# Patient Record
Sex: Male | Born: 1960 | Race: White | Hispanic: No | Marital: Single | Smoking: Current every day smoker
Health system: Southern US, Community
[De-identification: ages and names within clinical notes are randomized; demographics above are authoritative.]

## PROBLEM LIST (undated history)

## (undated) DIAGNOSIS — I10 Essential (primary) hypertension: Secondary | ICD-10-CM

## (undated) HISTORY — PX: HAND SURGERY: SHX662

## (undated) HISTORY — PX: ABDOMINAL SURGERY: SHX537

---

## 1999-07-08 ENCOUNTER — Emergency Department (HOSPITAL_COMMUNITY): Admission: EM | Admit: 1999-07-08 | Discharge: 1999-07-08 | Payer: Self-pay | Admitting: Emergency Medicine

## 2000-03-21 ENCOUNTER — Inpatient Hospital Stay (HOSPITAL_COMMUNITY): Admission: EM | Admit: 2000-03-21 | Discharge: 2000-03-21 | Payer: Self-pay | Admitting: *Deleted

## 2000-03-21 ENCOUNTER — Encounter: Payer: Self-pay | Admitting: *Deleted

## 2001-06-03 ENCOUNTER — Encounter: Payer: Self-pay | Admitting: Specialist

## 2001-06-03 ENCOUNTER — Inpatient Hospital Stay (HOSPITAL_COMMUNITY): Admission: EM | Admit: 2001-06-03 | Discharge: 2001-06-05 | Payer: Self-pay | Admitting: Emergency Medicine

## 2001-06-03 ENCOUNTER — Encounter: Payer: Self-pay | Admitting: Emergency Medicine

## 2010-10-29 ENCOUNTER — Observation Stay (HOSPITAL_COMMUNITY)
Admission: EM | Admit: 2010-10-29 | Discharge: 2010-10-30 | Disposition: A | Discharge status: Home / Self Care | Payer: Self-pay

## 2010-10-29 DIAGNOSIS — S0003XA Contusion of scalp, initial encounter: Secondary | ICD-10-CM | POA: Insufficient documentation

## 2010-10-29 DIAGNOSIS — IMO0002 Reserved for concepts with insufficient information to code with codable children: Secondary | ICD-10-CM | POA: Insufficient documentation

## 2010-10-29 DIAGNOSIS — F17200 Nicotine dependence, unspecified, uncomplicated: Secondary | ICD-10-CM | POA: Insufficient documentation

## 2010-10-29 DIAGNOSIS — Y929 Unspecified place or not applicable: Secondary | ICD-10-CM | POA: Insufficient documentation

## 2010-10-29 DIAGNOSIS — F1020 Alcohol dependence, uncomplicated: Secondary | ICD-10-CM | POA: Insufficient documentation

## 2010-10-29 DIAGNOSIS — Z23 Encounter for immunization: Secondary | ICD-10-CM | POA: Insufficient documentation

## 2010-10-29 DIAGNOSIS — S1093XA Contusion of unspecified part of neck, initial encounter: Secondary | ICD-10-CM | POA: Insufficient documentation

## 2010-10-29 DIAGNOSIS — S066X0A Traumatic subarachnoid hemorrhage without loss of consciousness, initial encounter: Principal | ICD-10-CM | POA: Insufficient documentation

## 2010-10-29 LAB — CBC
HCT: 39.7 % (ref 39.0–52.0)
Hemoglobin: 13.1 g/dL (ref 13.0–17.0)
MCH: 30.6 pg (ref 26.0–34.0)
MCHC: 33 g/dL (ref 30.0–36.0)
MCV: 92.8 fL (ref 78.0–100.0)
Platelets: 322 10*3/uL (ref 150–400)
RBC: 4.28 MIL/uL (ref 4.22–5.81)
RDW: 13.9 % (ref 11.5–15.5)
WBC: 10.1 10*3/uL (ref 4.0–10.5)

## 2010-10-29 LAB — DIFFERENTIAL
Basophils Absolute: 0.1 10*3/uL (ref 0.0–0.1)
Basophils Relative: 1 % (ref 0–1)
Eosinophils Absolute: 0.1 10*3/uL (ref 0.0–0.7)
Eosinophils Relative: 1 % (ref 0–5)
Lymphocytes Relative: 32 % (ref 12–46)
Lymphs Abs: 3.2 10*3/uL (ref 0.7–4.0)
Monocytes Absolute: 0.6 10*3/uL (ref 0.1–1.0)
Monocytes Relative: 6 % (ref 3–12)
Neutro Abs: 6.2 10*3/uL (ref 1.7–7.7)
Neutrophils Relative %: 61 % (ref 43–77)

## 2010-10-29 LAB — BASIC METABOLIC PANEL
CO2: 21 mEq/L (ref 19–32)
Calcium: 9 mg/dL (ref 8.4–10.5)
Chloride: 102 mEq/L (ref 96–112)
Creatinine, Ser: 1.08 mg/dL (ref 0.4–1.5)
GFR calc Af Amer: >60 mL/min (ref >60)
Glucose, Bld: 128 mg/dL — ABNORMAL HIGH (ref 70–99)
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
## 2011-03-22 ENCOUNTER — Emergency Department (HOSPITAL_COMMUNITY)
Admission: EM | Admit: 2011-03-22 | Discharge: 2011-03-23 | Disposition: A | Discharge status: Home / Self Care | Payer: Self-pay | Attending: Emergency Medicine | Admitting: Emergency Medicine

## 2011-03-22 ENCOUNTER — Emergency Department (HOSPITAL_COMMUNITY): Payer: Self-pay

## 2011-03-22 DIAGNOSIS — R109 Unspecified abdominal pain: Secondary | ICD-10-CM | POA: Insufficient documentation

## 2011-03-22 DIAGNOSIS — R112 Nausea with vomiting, unspecified: Secondary | ICD-10-CM | POA: Insufficient documentation

## 2011-03-22 DIAGNOSIS — F1020 Alcohol dependence, uncomplicated: Secondary | ICD-10-CM | POA: Insufficient documentation

## 2011-03-22 DIAGNOSIS — N200 Calculus of kidney: Secondary | ICD-10-CM | POA: Insufficient documentation

## 2011-03-22 DIAGNOSIS — F1010 Alcohol abuse, uncomplicated: Secondary | ICD-10-CM | POA: Insufficient documentation

## 2011-03-22 LAB — DIFFERENTIAL
Lymphs Abs: 1.6 10*3/uL (ref 0.7–4.0)
Monocytes Absolute: 0.4 10*3/uL (ref 0.1–1.0)
Monocytes Relative: 6 % (ref 3–12)
Neutro Abs: 5.2 10*3/uL (ref 1.7–7.7)
Neutrophils Relative %: 71 % (ref 43–77)

## 2011-03-22 LAB — URINALYSIS, ROUTINE W REFLEX MICROSCOPIC
Glucose, UA: NEGATIVE mg/dL
Ketones, ur: NEGATIVE mg/dL
Leukocytes, UA: NEGATIVE
Nitrite: NEGATIVE
Protein, ur: NEGATIVE mg/dL
Urobilinogen, UA: 0.2 mg/dL (ref 0.0–1.0)

## 2011-03-22 LAB — CBC
Hemoglobin: 15.7 g/dL (ref 13.0–17.0)
MCH: 30.9 pg (ref 26.0–34.0)
MCHC: 34.1 g/dL (ref 30.0–36.0)
MCV: 90.6 fL (ref 78.0–100.0)
RBC: 5.08 MIL/uL (ref 4.22–5.81)

## 2011-03-22 LAB — COMPREHENSIVE METABOLIC PANEL
BUN: 9 mg/dL (ref 6–23)
CO2: 27 mEq/L (ref 19–32)
Calcium: 8.8 mg/dL (ref 8.4–10.5)
Creatinine, Ser: 0.72 mg/dL (ref 0.50–1.35)
GFR calc Af Amer: >60 mL/min (ref >60)
GFR calc non Af Amer: >60 mL/min (ref >60)
Glucose, Bld: 125 mg/dL — ABNORMAL HIGH (ref 70–99)
Total Protein: 7.3 g/dL (ref 6.0–8.3)

## 2011-03-22 LAB — URINE MICROSCOPIC-ADD ON

## 2011-03-22 LAB — LIPASE, BLOOD: Lipase: 54 U/L (ref 11–59)

## 2011-03-22 LAB — ETHANOL: Alcohol, Ethyl (B): 269 mg/dL — ABNORMAL HIGH (ref 0–11)

## 2011-03-23 MED ORDER — IOHEXOL 300 MG/ML  SOLN
125.0000 mL | Freq: Once | INTRAMUSCULAR | Status: AC | PRN
  Administered 2011-03-23: 125 mL via INTRAVENOUS

## 2011-03-24 LAB — URINE CULTURE
Colony Count: 4000
Culture  Setup Time: 201206240334

## 2011-04-16 ENCOUNTER — Emergency Department (HOSPITAL_COMMUNITY)
Admission: EM | Admit: 2011-04-16 | Discharge: 2011-04-18 | Disposition: A | Discharge status: Home / Self Care | Payer: Self-pay | Attending: Emergency Medicine | Admitting: Emergency Medicine

## 2011-04-16 DIAGNOSIS — F1010 Alcohol abuse, uncomplicated: Secondary | ICD-10-CM | POA: Insufficient documentation

## 2011-04-16 LAB — COMPREHENSIVE METABOLIC PANEL
ALT: 55 U/L — ABNORMAL HIGH (ref 0–53)
Alkaline Phosphatase: 93 U/L (ref 39–117)
CO2: 28 mEq/L (ref 19–32)
GFR calc Af Amer: >60 mL/min (ref >60)
GFR calc non Af Amer: >60 mL/min (ref >60)
Glucose, Bld: 111 mg/dL — ABNORMAL HIGH (ref 70–99)
Potassium: 3.4 mEq/L — ABNORMAL LOW (ref 3.5–5.1)
Sodium: 137 mEq/L (ref 135–145)
Total Bilirubin: 0.4 mg/dL (ref 0.3–1.2)

## 2011-04-16 LAB — DIFFERENTIAL
Eosinophils Absolute: 0.2 10*3/uL (ref 0.0–0.7)
Eosinophils Relative: 2 % (ref 0–5)
Lymphs Abs: 3.9 10*3/uL (ref 0.7–4.0)

## 2011-04-16 LAB — CBC
MCH: 31.9 pg (ref 26.0–34.0)
MCV: 92.8 fL (ref 78.0–100.0)
Platelets: 199 10*3/uL (ref 150–400)
RDW: 15 % (ref 11.5–15.5)
WBC: 8.7 10*3/uL (ref 4.0–10.5)

## 2011-07-14 ENCOUNTER — Emergency Department (HOSPITAL_COMMUNITY): Payer: Self-pay

## 2011-07-14 ENCOUNTER — Emergency Department (HOSPITAL_COMMUNITY)
Admission: EM | Admit: 2011-07-14 | Discharge: 2011-07-15 | Disposition: A | Discharge status: Home / Self Care | Payer: Self-pay | Attending: Emergency Medicine | Admitting: Emergency Medicine

## 2011-07-14 DIAGNOSIS — IMO0002 Reserved for concepts with insufficient information to code with codable children: Secondary | ICD-10-CM | POA: Insufficient documentation

## 2011-07-14 DIAGNOSIS — M542 Cervicalgia: Secondary | ICD-10-CM | POA: Insufficient documentation

## 2011-07-14 DIAGNOSIS — S0100XA Unspecified open wound of scalp, initial encounter: Secondary | ICD-10-CM | POA: Insufficient documentation

## 2011-07-14 DIAGNOSIS — R51 Headache: Secondary | ICD-10-CM | POA: Insufficient documentation

## 2011-07-14 DIAGNOSIS — F17200 Nicotine dependence, unspecified, uncomplicated: Secondary | ICD-10-CM | POA: Insufficient documentation

## 2011-07-14 DIAGNOSIS — M546 Pain in thoracic spine: Secondary | ICD-10-CM | POA: Insufficient documentation

## 2011-07-14 LAB — POCT I-STAT, CHEM 8
BUN: 5 mg/dL — ABNORMAL LOW (ref 6–23)
Calcium, Ion: 1.06 mmol/L — ABNORMAL LOW (ref 1.12–1.32)
Chloride: 98 meq/L (ref 96–112)
Creatinine, Ser: 1 mg/dL (ref 0.50–1.35)
Glucose, Bld: 91 mg/dL (ref 70–99)
HCT: 54 % — ABNORMAL HIGH (ref 39.0–52.0)
Hemoglobin: 18.4 g/dL — ABNORMAL HIGH (ref 13.0–17.0)
Potassium: 3.5 meq/L (ref 3.5–5.1)
Sodium: 139 meq/L (ref 135–145)
TCO2: 26 mmol/L (ref 0–100)

## 2011-07-14 LAB — LACTIC ACID, PLASMA: Lactic Acid, Venous: 2.7 mmol/L — ABNORMAL HIGH (ref 0.5–2.2)

## 2011-07-14 LAB — COMPREHENSIVE METABOLIC PANEL
Albumin: 4.4 g/dL (ref 3.5–5.2)
Alkaline Phosphatase: 83 U/L (ref 39–117)
BUN: 7 mg/dL (ref 6–23)
Chloride: 96 mEq/L (ref 96–112)
Creatinine, Ser: 0.58 mg/dL (ref 0.50–1.35)
GFR calc Af Amer: >90 mL/min (ref >90)
GFR calc non Af Amer: >90 mL/min (ref >90)
Glucose, Bld: 91 mg/dL (ref 70–99)
Total Bilirubin: 0.5 mg/dL (ref 0.3–1.2)

## 2011-07-14 LAB — RAPID URINE DRUG SCREEN, HOSP PERFORMED
Amphetamines: NOT DETECTED
Barbiturates: NOT DETECTED
Benzodiazepines: NOT DETECTED
Cocaine: NOT DETECTED
Opiates: NOT DETECTED
Tetrahydrocannabinol: NOT DETECTED

## 2011-07-14 LAB — URINALYSIS, ROUTINE W REFLEX MICROSCOPIC
Bilirubin Urine: NEGATIVE
Glucose, UA: NEGATIVE mg/dL
Hgb urine dipstick: NEGATIVE
Ketones, ur: NEGATIVE mg/dL
Leukocytes, UA: NEGATIVE
Nitrite: NEGATIVE
Protein, ur: NEGATIVE mg/dL
Specific Gravity, Urine: 1.003 — ABNORMAL LOW (ref 1.005–1.030)
Urobilinogen, UA: 0.2 mg/dL (ref 0.0–1.0)
pH: 7 (ref 5.0–8.0)

## 2011-07-14 LAB — CBC
HCT: 46.4 % (ref 39.0–52.0)
Hemoglobin: 16 g/dL (ref 13.0–17.0)
MCH: 31.7 pg (ref 26.0–34.0)
MCHC: 34.5 g/dL (ref 30.0–36.0)
MCV: 91.9 fL (ref 78.0–100.0)
Platelets: 189 10*3/uL (ref 150–400)
RBC: 5.05 MIL/uL (ref 4.22–5.81)
RDW: 14.6 % (ref 11.5–15.5)
WBC: 7.4 10*3/uL (ref 4.0–10.5)

## 2011-07-14 LAB — COMPREHENSIVE METABOLIC PANEL WITH GFR
ALT: 35 U/L (ref 0–53)
AST: 50 U/L — ABNORMAL HIGH (ref 0–37)
CO2: 26 meq/L (ref 19–32)
Calcium: 9.8 mg/dL (ref 8.4–10.5)
Potassium: 3.5 meq/L (ref 3.5–5.1)
Sodium: 137 meq/L (ref 135–145)
Total Protein: 8.1 g/dL (ref 6.0–8.3)

## 2011-07-14 LAB — PROTIME-INR
INR: 0.82 (ref 0.00–1.49)
Prothrombin Time: 11.5 s — ABNORMAL LOW (ref 11.6–15.2)

## 2011-07-14 LAB — ETHANOL: Alcohol, Ethyl (B): 248 mg/dL — ABNORMAL HIGH (ref 0–11)

## 2012-04-04 ENCOUNTER — Encounter (HOSPITAL_COMMUNITY): Payer: Self-pay | Admitting: Emergency Medicine

## 2012-04-04 ENCOUNTER — Inpatient Hospital Stay: Admit: 2012-04-04 | Payer: Self-pay | Admitting: Orthopedic Surgery

## 2012-04-04 ENCOUNTER — Emergency Department (HOSPITAL_COMMUNITY): Payer: Self-pay | Admitting: Certified Registered Nurse Anesthetist

## 2012-04-04 ENCOUNTER — Encounter (HOSPITAL_COMMUNITY): Payer: Self-pay | Admitting: Certified Registered Nurse Anesthetist

## 2012-04-04 ENCOUNTER — Emergency Department (HOSPITAL_COMMUNITY): Payer: Self-pay

## 2012-04-04 ENCOUNTER — Emergency Department (HOSPITAL_COMMUNITY)
Admission: EM | Admit: 2012-04-04 | Discharge: 2012-04-04 | Disposition: A | Discharge status: Home / Self Care | Payer: Self-pay | Attending: Emergency Medicine | Admitting: Emergency Medicine

## 2012-04-04 ENCOUNTER — Encounter (HOSPITAL_COMMUNITY)
Admission: EM | Disposition: A | Discharge status: Home / Self Care | Payer: Self-pay | Source: Home / Self Care | Attending: Emergency Medicine

## 2012-04-04 DIAGNOSIS — K219 Gastro-esophageal reflux disease without esophagitis: Secondary | ICD-10-CM | POA: Insufficient documentation

## 2012-04-04 DIAGNOSIS — S66909A Unspecified injury of unspecified muscle, fascia and tendon at wrist and hand level, unspecified hand, initial encounter: Secondary | ICD-10-CM | POA: Insufficient documentation

## 2012-04-04 DIAGNOSIS — S61509A Unspecified open wound of unspecified wrist, initial encounter: Secondary | ICD-10-CM | POA: Insufficient documentation

## 2012-04-04 DIAGNOSIS — Y92009 Unspecified place in unspecified non-institutional (private) residence as the place of occurrence of the external cause: Secondary | ICD-10-CM | POA: Insufficient documentation

## 2012-04-04 DIAGNOSIS — W010XXA Fall on same level from slipping, tripping and stumbling without subsequent striking against object, initial encounter: Secondary | ICD-10-CM | POA: Insufficient documentation

## 2012-04-04 DIAGNOSIS — W268XXA Contact with other sharp object(s), not elsewhere classified, initial encounter: Secondary | ICD-10-CM | POA: Insufficient documentation

## 2012-04-04 DIAGNOSIS — F17200 Nicotine dependence, unspecified, uncomplicated: Secondary | ICD-10-CM | POA: Insufficient documentation

## 2012-04-04 DIAGNOSIS — S61419A Laceration without foreign body of unspecified hand, initial encounter: Secondary | ICD-10-CM

## 2012-04-04 HISTORY — PX: I&D EXTREMITY: SHX5045

## 2012-04-04 HISTORY — PX: TENDON REPAIR: SHX5111

## 2012-04-04 LAB — CBC
HCT: 46.3 % (ref 39.0–52.0)
Hemoglobin: 16 g/dL (ref 13.0–17.0)
MCH: 31.4 pg (ref 26.0–34.0)
MCV: 90.8 fL (ref 78.0–100.0)
RBC: 5.1 MIL/uL (ref 4.22–5.81)
WBC: 12 10*3/uL — ABNORMAL HIGH (ref 4.0–10.5)

## 2012-04-04 LAB — BASIC METABOLIC PANEL
CO2: 23 mEq/L (ref 19–32)
Calcium: 9.1 mg/dL (ref 8.4–10.5)
Chloride: 100 mEq/L (ref 96–112)
Creatinine, Ser: 0.68 mg/dL (ref 0.50–1.35)
Glucose, Bld: 98 mg/dL (ref 70–99)

## 2012-04-04 SURGERY — IRRIGATION AND DEBRIDEMENT EXTREMITY
Anesthesia: General | Site: Wrist | Laterality: Left | Wound class: Contaminated

## 2012-04-04 MED ORDER — OXYCODONE-ACETAMINOPHEN 10-325 MG PO TABS: 1.0000 | ORAL_TABLET | ORAL | Status: AC | PRN

## 2012-04-04 MED ORDER — LIDOCAINE HCL (CARDIAC) 20 MG/ML IV SOLN
INTRAVENOUS | Status: DC | PRN
  Administered 2012-04-04: 100 mg via INTRAVENOUS

## 2012-04-04 MED ORDER — ONDANSETRON HCL 4 MG/2ML IJ SOLN: 4.0000 mg | Freq: Once | INTRAMUSCULAR | Status: DC | PRN

## 2012-04-04 MED ORDER — ONDANSETRON HCL 4 MG/2ML IJ SOLN
INTRAMUSCULAR | Status: DC | PRN
  Administered 2012-04-04: 4 mg via INTRAVENOUS

## 2012-04-04 MED ORDER — DEXAMETHASONE SODIUM PHOSPHATE 4 MG/ML IJ SOLN
INTRAMUSCULAR | Status: DC | PRN
  Administered 2012-04-04: 4 mg via INTRAVENOUS

## 2012-04-04 MED ORDER — HYDROMORPHONE HCL PF 1 MG/ML IJ SOLN
INTRAMUSCULAR | Status: AC
  Filled 2012-04-04: qty 1

## 2012-04-04 MED ORDER — HYDROMORPHONE HCL PF 1 MG/ML IJ SOLN
0.2500 mg | INTRAMUSCULAR | Status: DC | PRN
  Administered 2012-04-04 (×3): 0.5 mg via INTRAVENOUS

## 2012-04-04 MED ORDER — TETANUS-DIPHTH-ACELL PERTUSSIS 5-2.5-18.5 LF-MCG/0.5 IM SUSP
0.5000 mL | Freq: Once | INTRAMUSCULAR | Status: AC
  Administered 2012-04-04: 0.5 mL via INTRAMUSCULAR
  Filled 2012-04-04: qty 0.5

## 2012-04-04 MED ORDER — OXYCODONE-ACETAMINOPHEN 5-325 MG PO TABS
1.0000 | ORAL_TABLET | ORAL | Status: DC | PRN
  Administered 2012-04-04: 2 via ORAL

## 2012-04-04 MED ORDER — BUPIVACAINE HCL (PF) 0.25 % IJ SOLN
INTRAMUSCULAR | Status: DC | PRN
  Administered 2012-04-04: 10 mL

## 2012-04-04 MED ORDER — CEPHALEXIN 500 MG PO CAPS: 500.0000 mg | ORAL_CAPSULE | Freq: Four times a day (QID) | ORAL | Status: AC

## 2012-04-04 MED ORDER — HYDROMORPHONE HCL PF 1 MG/ML IJ SOLN
1.0000 mg | Freq: Once | INTRAMUSCULAR | Status: AC
  Administered 2012-04-04: 1 mg via INTRAMUSCULAR
  Filled 2012-04-04: qty 1

## 2012-04-04 MED ORDER — OXYCODONE-ACETAMINOPHEN 5-325 MG PO TABS
ORAL_TABLET | ORAL | Status: AC
  Filled 2012-04-04: qty 2

## 2012-04-04 MED ORDER — PROPOFOL 10 MG/ML IV EMUL
INTRAVENOUS | Status: DC | PRN
  Administered 2012-04-04: 200 mg via INTRAVENOUS

## 2012-04-04 MED ORDER — CEFAZOLIN SODIUM 1-5 GM-% IV SOLN
INTRAVENOUS | Status: DC | PRN
  Administered 2012-04-04: 2 g via INTRAVENOUS

## 2012-04-04 MED ORDER — FENTANYL CITRATE 0.05 MG/ML IJ SOLN
INTRAMUSCULAR | Status: DC | PRN
  Administered 2012-04-04: 100 ug via INTRAVENOUS
  Administered 2012-04-04: 50 ug via INTRAVENOUS

## 2012-04-04 MED ORDER — PHENYLEPHRINE HCL 10 MG/ML IJ SOLN
INTRAMUSCULAR | Status: DC | PRN
  Administered 2012-04-04: 80 ug via INTRAVENOUS

## 2012-04-04 MED ORDER — SODIUM CHLORIDE 0.9 % IR SOLN
Status: DC | PRN
  Administered 2012-04-04: 1

## 2012-04-04 MED ORDER — LACTATED RINGERS IV SOLN
INTRAVENOUS | Status: DC | PRN
  Administered 2012-04-04: 08:00:00 via INTRAVENOUS

## 2012-04-04 MED ORDER — DOCUSATE SODIUM 100 MG PO CAPS: 100.0000 mg | ORAL_CAPSULE | Freq: Two times a day (BID) | ORAL | Status: AC

## 2012-04-04 MED ORDER — MIDAZOLAM HCL 5 MG/5ML IJ SOLN
INTRAMUSCULAR | Status: DC | PRN
  Administered 2012-04-04: 2 mg via INTRAVENOUS

## 2012-04-04 SURGICAL SUPPLY — 64 items
BANDAGE CONFORM 2  STR LF (GAUZE/BANDAGES/DRESSINGS) IMPLANT
BANDAGE ELASTIC 3 VELCRO ST LF (GAUZE/BANDAGES/DRESSINGS) ×3 IMPLANT
BANDAGE ELASTIC 4 VELCRO ST LF (GAUZE/BANDAGES/DRESSINGS) ×3 IMPLANT
BANDAGE GAUZE ELAST BULKY 4 IN (GAUZE/BANDAGES/DRESSINGS) ×3 IMPLANT
BNDG CMPR 9X4 STRL LF SNTH (GAUZE/BANDAGES/DRESSINGS) ×2
BNDG COHESIVE 1X5 TAN STRL LF (GAUZE/BANDAGES/DRESSINGS) IMPLANT
BNDG ESMARK 4X9 LF (GAUZE/BANDAGES/DRESSINGS) ×3 IMPLANT
CLOTH BEACON ORANGE TIMEOUT ST (SAFETY) ×3 IMPLANT
CORDS BIPOLAR (ELECTRODE) ×3 IMPLANT
COVER SURGICAL LIGHT HANDLE (MISCELLANEOUS) ×3 IMPLANT
CUFF TOURNIQUET SINGLE 18IN (TOURNIQUET CUFF) ×3 IMPLANT
CUFF TOURNIQUET SINGLE 24IN (TOURNIQUET CUFF) IMPLANT
DRAIN PENROSE 1/4X12 LTX STRL (WOUND CARE) IMPLANT
DRAPE SURG 17X23 STRL (DRAPES) ×3 IMPLANT
DRSG ADAPTIC 3X8 NADH LF (GAUZE/BANDAGES/DRESSINGS) ×3 IMPLANT
ELECT REM PT RETURN 9FT ADLT (ELECTROSURGICAL)
ELECTRODE REM PT RTRN 9FT ADLT (ELECTROSURGICAL) IMPLANT
GAUZE XEROFORM 1X8 LF (GAUZE/BANDAGES/DRESSINGS) ×2 IMPLANT
GAUZE XEROFORM 5X9 LF (GAUZE/BANDAGES/DRESSINGS) IMPLANT
GLOVE BIO SURGEON STRL SZ 6.5 (GLOVE) ×2 IMPLANT
GLOVE BIOGEL PI IND STRL 7.0 (GLOVE) IMPLANT
GLOVE BIOGEL PI IND STRL 8 (GLOVE) IMPLANT
GLOVE BIOGEL PI IND STRL 8.5 (GLOVE) ×2 IMPLANT
GLOVE BIOGEL PI INDICATOR 7.0 (GLOVE) ×1
GLOVE BIOGEL PI INDICATOR 8 (GLOVE) ×1
GLOVE BIOGEL PI INDICATOR 8.5 (GLOVE) ×1
GLOVE SURG ORTHO 8.0 STRL STRW (GLOVE) ×3 IMPLANT
GOWN PREVENTION PLUS XLARGE (GOWN DISPOSABLE) ×4 IMPLANT
GOWN STRL NON-REIN LRG LVL3 (GOWN DISPOSABLE) ×6 IMPLANT
HANDPIECE INTERPULSE COAX TIP (DISPOSABLE)
KIT BASIN OR (CUSTOM PROCEDURE TRAY) ×3 IMPLANT
KIT ROOM TURNOVER OR (KITS) ×3 IMPLANT
MANIFOLD NEPTUNE II (INSTRUMENTS) ×3 IMPLANT
NDL HYPO 25GX1X1/2 BEV (NEEDLE) IMPLANT
NEEDLE HYPO 25GX1X1/2 BEV (NEEDLE) ×3 IMPLANT
NS IRRIG 1000ML POUR BTL (IV SOLUTION) ×3 IMPLANT
PACK ORTHO EXTREMITY (CUSTOM PROCEDURE TRAY) ×3 IMPLANT
PAD ARMBOARD 7.5X6 YLW CONV (MISCELLANEOUS) ×5 IMPLANT
PAD CAST 4YDX4 CTTN HI CHSV (CAST SUPPLIES) ×2 IMPLANT
PADDING CAST ABS 4INX4YD NS (CAST SUPPLIES) ×2
PADDING CAST ABS COTTON 4X4 ST (CAST SUPPLIES) IMPLANT
PADDING CAST COTTON 4X4 STRL (CAST SUPPLIES)
SET HNDPC FAN SPRY TIP SCT (DISPOSABLE) IMPLANT
SOAP 2 % CHG 4 OZ (WOUND CARE) ×3 IMPLANT
SPLINT FIBERGLASS 4X30 (CAST SUPPLIES) ×1 IMPLANT
SPONGE GAUZE 4X4 12PLY (GAUZE/BANDAGES/DRESSINGS) ×3 IMPLANT
SPONGE LAP 18X18 X RAY DECT (DISPOSABLE) ×3 IMPLANT
SPONGE LAP 4X18 X RAY DECT (DISPOSABLE) ×2 IMPLANT
SUCTION FRAZIER TIP 10 FR DISP (SUCTIONS) ×3 IMPLANT
SUT ETHILON 4 0 PS 2 18 (SUTURE) IMPLANT
SUT ETHILON 5 0 P 3 18 (SUTURE)
SUT FIBERWIRE 3-0 18 TAPR NDL (SUTURE) ×6
SUT NYLON ETHILON 5-0 P-3 1X18 (SUTURE) ×2 IMPLANT
SUT PROLENE 4 0 PS 2 18 (SUTURE) ×4 IMPLANT
SUTURE FIBERWR 3-0 18 TAPR NDL (SUTURE) IMPLANT
SYR CONTROL 10ML LL (SYRINGE) ×1 IMPLANT
TOWEL OR 17X24 6PK STRL BLUE (TOWEL DISPOSABLE) ×3 IMPLANT
TOWEL OR 17X26 10 PK STRL BLUE (TOWEL DISPOSABLE) ×3 IMPLANT
TUBE ANAEROBIC SPECIMEN COL (MISCELLANEOUS) IMPLANT
TUBE CONNECTING 12X1/4 (SUCTIONS) ×3 IMPLANT
TUBING CYSTO DISP (UROLOGICAL SUPPLIES) ×1 IMPLANT
UNDERPAD 30X30 INCONTINENT (UNDERPADS AND DIAPERS) ×3 IMPLANT
WATER STERILE IRR 1000ML POUR (IV SOLUTION) ×2 IMPLANT
YANKAUER SUCT BULB TIP NO VENT (SUCTIONS) ×3 IMPLANT

## 2012-04-04 NOTE — ED Notes (Addendum)
Patient complaining of left wrist/hand laceration that occurred when he fell on a piece of metal in his back yard this evening; last tetanus shot more than 5 years ago.  Laceration wrapped with clean guaze; active bleeding noted at site.  Radial pulse palpable; strong +3.

## 2012-04-04 NOTE — Anesthesia Postprocedure Evaluation (Signed)
  Anesthesia Post-op Note  Patient: Peter Howell  Procedure(s) Performed: Procedure(s) (LRB): IRRIGATION AND DEBRIDEMENT EXTREMITY (Left) WOUND EXPLORATION (Left) TENDON REPAIR (Left)  Patient Location: PACU  Anesthesia Type: General  Level of Consciousness: awake, alert  and oriented  Airway and Oxygen Therapy: Patient Spontanous Breathing and Patient connected to nasal cannula oxygen  Post-op Pain: moderate  Post-op Assessment: Post-op Vital signs reviewed  Post-op Vital Signs: Reviewed  Complications: No apparent anesthesia complications

## 2012-04-04 NOTE — ED Provider Notes (Signed)
History     CSN: [id number]  Arrival date & time 04/04/12  0040   First MD Initiated Contact with Patient 04/04/12 0257      Chief Complaint  Patient presents with  . Extremity Laceration    (Consider location/radiation/quality/duration/timing/severity/associated sxs/prior treatment) HPI History provided by patient. Cut his left wrist and hand when he fell in his backyard, lacerating himself on a sharp metal edge. He denies any weakness or numbness, has persistent bleeding on arrival. Last tetanus shot more than 5 years ago. The pain is sharp in quality nonradiating. Patient denies any other pain in her trauma. Symptoms constant and persistent since onset. Denies any drug or alcohol use. History reviewed. No pertinent past medical history.  Past Surgical History  Procedure Date  . Hand surgery     History reviewed. No pertinent family history.  History  Substance Use Topics  . Smoking status: Current Everyday Smoker  . Smokeless tobacco: Not on file  . Alcohol Use: Yes      Review of Systems  Constitutional: Negative for fever and chills.  HENT: Negative for neck pain and neck stiffness.   Eyes: Negative for pain.  Respiratory: Negative for shortness of breath.   Cardiovascular: Negative for chest pain.  Gastrointestinal: Negative for abdominal pain.  Genitourinary: Negative for dysuria.  Musculoskeletal: Negative for back pain.  Skin: Positive for wound. Negative for rash.  Neurological: Negative for headaches.  All other systems reviewed and are negative.    Allergies  Review of patient's allergies indicates no known allergies.  Home Medications  No current outpatient prescriptions on file.  BP 162/101  Pulse 97  Temp 98.1 F (36.7 C) (Oral)  Resp 18  Physical Exam  Constitutional: He is oriented to person, place, and time. He appears well-developed and well-nourished.  HENT:  Head: Normocephalic and atraumatic.  Eyes: Conjunctivae and EOM are  normal. Pupils are equal, round, and reactive to light.  Neck: Trachea normal. Neck supple. No thyromegaly present.  Cardiovascular: Normal rate, regular rhythm, S1 normal, S2 normal and normal pulses.     No systolic murmur is present   No diastolic murmur is present  Pulses:      Radial pulses are 2+ on the right side, and 2+ on the left side.  Pulmonary/Chest: Effort normal and breath sounds normal. He has no wheezes. He has no rhonchi. He has no rales. He exhibits no tenderness.  Abdominal: Soft. Normal appearance and bowel sounds are normal. There is no tenderness. There is no CVA tenderness and negative Murphy's sign.  Musculoskeletal:       Left upper extremity: Large gaping laceration to volar/ palmar aspect of hand/ wrist, deep structures visulaized, unable to proximate tissue 2/2 swelling, mild oozing present but no evidence of arterial bleeding, bounding radial and ulnar pulses present, distal n/v intact without obvious deficits.   Neurological: He is alert and oriented to person, place, and time. He has normal strength. No cranial nerve deficit or sensory deficit. GCS eye subscore is 4. GCS verbal subscore is 5. GCS motor subscore is 6.  Skin: Skin is warm and dry. No rash noted. He is not diaphoretic.  Psychiatric: His speech is normal.       Cooperative and appropriate    ED Course  Procedures (including critical care time)   Dg Wrist Complete Left  04/04/2012  *RADIOLOGY REPORT*  Clinical Data: Status post fall, with laceration to the left wrist.  LEFT WRIST - COMPLETE 3+ VIEW  Comparison:  None.  Findings: There is no evidence of fracture or dislocation.  The carpal rows are intact, and demonstrate normal alignment.  The joint spaces are preserved.  Soft tissue disruption is noted about the ulnar and volar aspects of the wrist, with associated soft tissue swelling.  No radiopaque foreign bodies are seen.  IMPRESSION: No evidence of fracture or dislocation; no radiopaque foreign  bodies seen.  Original Report Authenticated By: Tonia Ghent, M.D.    Labs Reviewed - No data to display Dg Wrist Complete Left  04/04/2012  *RADIOLOGY REPORT*  Clinical Data: Status post fall, with laceration to the left wrist.  LEFT WRIST - COMPLETE 3+ VIEW  Comparison: None.  Findings: There is no evidence of fracture or dislocation.  The carpal rows are intact, and demonstrate normal alignment.  The joint spaces are preserved.  Soft tissue disruption is noted about the ulnar and volar aspects of the wrist, with associated soft tissue swelling.  No radiopaque foreign bodies are seen.  IMPRESSION: No evidence of fracture or dislocation; no radiopaque foreign bodies seen.  Original Report Authenticated By: Tonia Ghent, M.D.   Wound irrigated extensively. Imaging obtained and reviewed as above. Tetanus updated. Wound inspected and given significant gaping hand and wrist wound, Hand consult requested.  6:01 AM d/w DR Orlan Leavens will take to the OR for washout. IV placed. MDM   Large gaping hand and wrist wound, plan OR for washout and further evaluation.        Sunnie Nielsen, MD 04/05/12 [phone]

## 2012-04-04 NOTE — ED Notes (Signed)
Patient  Stated he came out of his  Back door and feel down landing on top of a piece of metal roofing.  Left hand dressed while in triage but bleeding through the dressing.  Moves all fingers well, +pulses to hand.

## 2012-04-04 NOTE — Transfer of Care (Signed)
Immediate Anesthesia Transfer of Care Note  Patient: Peter Howell  Procedure(s) Performed: Procedure(s) (LRB): IRRIGATION AND DEBRIDEMENT EXTREMITY (Left) WOUND EXPLORATION (Left) TENDON REPAIR (Left)  Patient Location: PACU  Anesthesia Type: General  Level of Consciousness: awake, alert , oriented and patient cooperative  Airway & Oxygen Therapy: Patient Spontanous Breathing and Patient connected to nasal cannula oxygen  Post-op Assessment: Report given to PACU RN, Post -op Vital signs reviewed and stable and Patient moving all extremities  Post vital signs: Reviewed and stable  Complications: No apparent anesthesia complications

## 2012-04-04 NOTE — ED Notes (Signed)
To OR

## 2012-04-04 NOTE — H&P (Signed)
Peter Howell is an 51 y.o. male.   Chief Complaint: LACERATION TO LEFT WRIST HPI: PT FELL EARLIER THIS AM SUSTAINED SHARP LACERATION TO METAL OBJECT PRESENTED TO ED WITH LARGE OPEN WOUND ED NOTES REVIEWED PT WITHOUT ANY OTHER LACERATIONS OR CONCERNS NO HISTORY OF TRAUMA TO LEFT WRIST PT HERE FOR SURGERY TO REPAIR LARGE COMPLEX WOUND TO LEFT WRIST  History reviewed. No pertinent past medical history.  Past Surgical History  Procedure Date  . Hand surgery     History reviewed. No pertinent family history. Social History:  reports that he has been smoking.  He does not have any smokeless tobacco history on file. He reports that he drinks alcohol. He reports that he does not use illicit drugs.  Allergies: No Known Allergies   (Not in a hospital admission)  Results for orders placed during the hospital encounter of 04/04/12 (from the past 48 hour(s))  CBC     Status: Abnormal   Collection Time   04/04/12  6:17 AM      Component Value Range Comment   WBC 12.0 (*) 4.0 - 10.5 K/uL    RBC 5.10  4.22 - 5.81 MIL/uL    Hemoglobin 16.0  13.0 - 17.0 g/dL    HCT 16.1  09.6 - 04.5 %    MCV 90.8  78.0 - 100.0 fL    MCH 31.4  26.0 - 34.0 pg    MCHC 34.6  30.0 - 36.0 g/dL    RDW 40.9  81.1 - 91.4 %    Platelets 261  150 - 400 K/uL   BASIC METABOLIC PANEL     Status: Normal   Collection Time   04/04/12  6:17 AM      Component Value Range Comment   Sodium 136  135 - 145 mEq/L    Potassium 4.2  3.5 - 5.1 mEq/L    Chloride 100  96 - 112 mEq/L    CO2 23  19 - 32 mEq/L    Glucose, Bld 98  70 - 99 mg/dL    BUN 12  6 - 23 mg/dL    Creatinine, Ser 7.82  0.50 - 1.35 mg/dL    Calcium 9.1  8.4 - 95.6 mg/dL    GFR calc non Af Amer >90  >90 mL/min    GFR calc Af Amer >90  >90 mL/min    Dg Wrist Complete Left  04/04/2012  *RADIOLOGY REPORT*  Clinical Data: Status post fall, with laceration to the left wrist.  LEFT WRIST - COMPLETE 3+ VIEW  Comparison: None.  Findings: There is no evidence of  fracture or dislocation.  The carpal rows are intact, and demonstrate normal alignment.  The joint spaces are preserved.  Soft tissue disruption is noted about the ulnar and volar aspects of the wrist, with associated soft tissue swelling.  No radiopaque foreign bodies are seen.  IMPRESSION: No evidence of fracture or dislocation; no radiopaque foreign bodies seen.  Original Report Authenticated By: Tonia Ghent, M.D.   NO RECENT ILLNESSES OR HOSPITALIZATIONS  Blood pressure 137/86, pulse 72, temperature 98.1 F (36.7 C), temperature source Oral, resp. rate 18, SpO2 93.00%. General Appearance:  Alert, cooperative, no distress, appears stated age  Head:  Normocephalic, without obvious abnormality, atraumatic  Eyes:  Pupils equal, conjunctiva/corneas clear,         Throat: Lips, mucosa, and tongue normal;   Neck: No visible masses     Lungs:   respirations unlabored  Chest Wall:  No  tenderness or deformity  Heart:  Regular rate and rhythm,  Abdomen:   Soft, non-tender,         Extremities: LEFT WRIST IN LARGE BANDAGE BANDAGE NOT REMOVED ABLE TO WIGGLE FINGERS FINGERS WARM WELL PERFUSED ABLE TO FLEX THUMB IP JOINT POOR HAND INTRINSIC FUNCTION   Pulses: 2+ and symmetric  Skin: Skin color, texture, turgor normal, no rashes or lesions     Neurologic: Normal    Assessment/Plan LEFT WRIST LACERTION, TRAUMATIC, COMPLEX   LEFT WRIST EXPLORATION AND REPAIR AS INDICATED  R/B/A DISCUSSED WITH PT IN HOLDING AREA.  PT VOICED UNDERSTANDING OF PLAN CONSENT SIGNED DAY OF SURGERY PT SEEN AND EXAMINED PRIOR TO OPERATIVE PROCEDURE/DAY OF SURGERY SITE MARKED. QUESTIONS ANSWERED WILL LIKELY [facility] FOLLOWING SURGERY  Sharma Covert 04/04/2012, 8:08 AM

## 2012-04-04 NOTE — Brief Op Note (Signed)
04/04/2012  8:12 AM  PATIENT:  Peter Howell  51 y.o. male  PRE-OPERATIVE DIAGNOSIS:  Left Hand Laceration  POST-OPERATIVE DIAGNOSIS: LEFT WRIST LACERATION  PROCEDURE:  Procedure(s) (LRB): IRRIGATION AND DEBRIDEMENT EXTREMITY (Left) LEFT WRIST LACERATION REPAIR FDS TO RING FDP TO LONG RING AND SMALL CARPAL TUNNEL RELEASE ULNAR NERVE RELEASE  SURGEON:  Surgeon(s) and Role:    * Sharma Covert, MD - Primary  PHYSICIAN ASSISTANT:   ASSISTANTS: none   ANESTHESIA:   general  EBL:  minimal  BLOOD ADMINISTERED:none  DRAINS: none   LOCAL MEDICATIONS USED:  MARCAINE     SPECIMEN:  No Specimen  DISPOSITION OF SPECIMEN:  N/A  COUNTS:  YES  TOURNIQUET:  * Missing tourniquet times found for documented tourniquets in log:  [id number] *  DICTATION: .[id number] PLAN OF CARE: Discharge to home after PACU  PATIENT DISPOSITION:  PACU - hemodynamically stable.   Delay start of Pharmacological VTE agent (>24hrs) due to surgical blood loss or risk of bleeding: not applicable

## 2012-04-04 NOTE — Anesthesia Preprocedure Evaluation (Addendum)
Anesthesia Evaluation  Patient identified by MRN, date of birth, ID band Patient awake    Reviewed: Allergy & Precautions, H&P , NPO status , Patient's Chart, lab work & pertinent test results  History of Anesthesia Complications Negative for: history of anesthetic complications  Airway Mallampati: I TM Distance: >3 FB Neck ROM: Full    Dental  (+) Teeth Intact and Dental Advisory Given   Pulmonary neg pulmonary ROS, Current Smoker (1 PPD),  Hx of occasional wheezing, Untreated. breath sounds clear to auscultation        Cardiovascular Exercise Tolerance: Good Rhythm:Regular Rate:Normal     Neuro/Psych negative neurological ROS  negative psych ROS   GI/Hepatic negative GI ROS, Neg liver ROS, GERD- (prn medication- usually after eating)  Medicated and Controlled,  Endo/Other  negative endocrine ROS  Renal/GU negative Renal ROS  negative genitourinary   Musculoskeletal negative musculoskeletal ROS (+)   Abdominal   Peds negative pediatric ROS (+)  Hematology negative hematology ROS (+)   Anesthesia Other Findings   Reproductive/Obstetrics                       Anesthesia Physical Anesthesia Plan  ASA: II  Anesthesia Plan: General   Post-op Pain Management:    Induction: Intravenous  Airway Management Planned: LMA  Additional Equipment:   Intra-op Plan:   Post-operative Plan: Extubation in OR  Informed Consent: I have reviewed the patients History and Physical, chart, labs and discussed the procedure including the risks, benefits and alternatives for the proposed anesthesia with the patient or authorized representative who has indicated his/her understanding and acceptance.   Dental advisory given  Plan Discussed with: CRNA, Anesthesiologist and Surgeon  Anesthesia Plan Comments:         Anesthesia Quick Evaluation

## 2012-04-04 NOTE — Addendum Note (Signed)
Addendum  created 04/04/12 1109 by Sharlet Salina, CRNA   Modules edited:Charges VN

## 2012-04-04 NOTE — Preoperative (Signed)
Beta Blockers   Reason not to administer Beta Blockers:Not Applicable 

## 2012-04-05 ENCOUNTER — Encounter (HOSPITAL_COMMUNITY): Payer: Self-pay | Admitting: Orthopedic Surgery

## 2012-04-05 MED FILL — Cefazolin in D5W Inj 1 GM/50ML: INTRAVENOUS | Qty: 100 | Status: AC
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
## 2013-02-04 ENCOUNTER — Emergency Department (HOSPITAL_COMMUNITY): Payer: No Typology Code available for payment source

## 2013-02-04 ENCOUNTER — Inpatient Hospital Stay (HOSPITAL_COMMUNITY)
Admission: EM | Admit: 2013-02-04 | Discharge: 2013-02-06 | DRG: 579 | Discharge status: Against Medical Advice | Payer: No Typology Code available for payment source

## 2013-02-04 ENCOUNTER — Encounter (HOSPITAL_COMMUNITY): Admission: EM | Discharge status: Against Medical Advice | Payer: Self-pay | Source: Home / Self Care

## 2013-02-04 ENCOUNTER — Other Ambulatory Visit: Payer: Self-pay

## 2013-02-04 ENCOUNTER — Emergency Department (HOSPITAL_COMMUNITY): Payer: No Typology Code available for payment source | Admitting: Anesthesiology

## 2013-02-04 ENCOUNTER — Encounter (HOSPITAL_COMMUNITY): Payer: Self-pay | Admitting: Emergency Medicine

## 2013-02-04 ENCOUNTER — Encounter (HOSPITAL_COMMUNITY): Payer: Self-pay | Admitting: Anesthesiology

## 2013-02-04 DIAGNOSIS — T07XXXA Unspecified multiple injuries, initial encounter: Secondary | ICD-10-CM

## 2013-02-04 DIAGNOSIS — F1010 Alcohol abuse, uncomplicated: Secondary | ICD-10-CM | POA: Diagnosis present

## 2013-02-04 DIAGNOSIS — S3690XA Unspecified injury of unspecified intra-abdominal organ, initial encounter: Secondary | ICD-10-CM | POA: Diagnosis present

## 2013-02-04 DIAGNOSIS — S21209A Unspecified open wound of unspecified back wall of thorax without penetration into thoracic cavity, initial encounter: Secondary | ICD-10-CM

## 2013-02-04 DIAGNOSIS — F17200 Nicotine dependence, unspecified, uncomplicated: Secondary | ICD-10-CM | POA: Diagnosis present

## 2013-02-04 DIAGNOSIS — D62 Acute posthemorrhagic anemia: Secondary | ICD-10-CM | POA: Diagnosis present

## 2013-02-04 DIAGNOSIS — S31119A Laceration without foreign body of abdominal wall, unspecified quadrant without penetration into peritoneal cavity, initial encounter: Secondary | ICD-10-CM

## 2013-02-04 DIAGNOSIS — S21119A Laceration without foreign body of unspecified front wall of thorax without penetration into thoracic cavity, initial encounter: Secondary | ICD-10-CM

## 2013-02-04 DIAGNOSIS — S21219A Laceration without foreign body of unspecified back wall of thorax without penetration into thoracic cavity, initial encounter: Secondary | ICD-10-CM

## 2013-02-04 DIAGNOSIS — S21109A Unspecified open wound of unspecified front wall of thorax without penetration into thoracic cavity, initial encounter: Principal | ICD-10-CM | POA: Diagnosis present

## 2013-02-04 HISTORY — PX: INCISION AND DRAINAGE OF WOUND: SHX1803

## 2013-02-04 HISTORY — PX: WOUND DEBRIDEMENT: SHX247

## 2013-02-04 LAB — COMPREHENSIVE METABOLIC PANEL
ALT: 62 U/L — ABNORMAL HIGH (ref 0–53)
Alkaline Phosphatase: 87 U/L (ref 39–117)
CO2: 20 mEq/L (ref 19–32)
Chloride: 98 mEq/L (ref 96–112)
GFR calc Af Amer: >90 mL/min (ref >90)
GFR calc non Af Amer: >90 mL/min (ref >90)
Glucose, Bld: 123 mg/dL — ABNORMAL HIGH (ref 70–99)
Potassium: 3.7 mEq/L (ref 3.5–5.1)
Sodium: 135 mEq/L (ref 135–145)
Total Bilirubin: 0.2 mg/dL — ABNORMAL LOW (ref 0.3–1.2)

## 2013-02-04 LAB — POCT I-STAT, CHEM 8
Creatinine, Ser: 1.3 mg/dL (ref 0.50–1.35)
HCT: 42 % (ref 39.0–52.0)
Hemoglobin: 14.3 g/dL (ref 13.0–17.0)
Potassium: 3.8 mEq/L (ref 3.5–5.1)
Sodium: 136 mEq/L (ref 135–145)

## 2013-02-04 LAB — TYPE AND SCREEN

## 2013-02-04 LAB — PROTIME-INR
INR: 0.86 (ref 0.00–1.49)
Prothrombin Time: 11.7 seconds (ref 11.6–15.2)

## 2013-02-04 LAB — CBC
HCT: 38.9 % — ABNORMAL LOW (ref 39.0–52.0)
MCHC: 35 g/dL (ref 30.0–36.0)
Platelets: 179 10*3/uL (ref 150–400)
RDW: 13.8 % (ref 11.5–15.5)

## 2013-02-04 LAB — ABO/RH: ABO/RH(D): A POS

## 2013-02-04 SURGERY — DEBRIDEMENT, WOUND, ABDOMEN
Anesthesia: General | Site: Back | Wound class: Dirty or Infected

## 2013-02-04 MED ORDER — NEOSTIGMINE METHYLSULFATE 1 MG/ML IJ SOLN
INTRAMUSCULAR | Status: DC | PRN
  Administered 2013-02-04: 4 mg via INTRAVENOUS

## 2013-02-04 MED ORDER — LACTATED RINGERS IV SOLN
INTRAVENOUS | Status: DC | PRN
  Administered 2013-02-04 (×3): via INTRAVENOUS

## 2013-02-04 MED ORDER — SUCCINYLCHOLINE CHLORIDE 20 MG/ML IJ SOLN
INTRAMUSCULAR | Status: DC | PRN
  Administered 2013-02-04: 120 mg via INTRAVENOUS

## 2013-02-04 MED ORDER — ONDANSETRON HCL 4 MG/2ML IJ SOLN
4.0000 mg | Freq: Once | INTRAMUSCULAR | Status: AC
  Administered 2013-02-04: 4 mg via INTRAVENOUS

## 2013-02-04 MED ORDER — PROMETHAZINE HCL 25 MG/ML IJ SOLN: 6.2500 mg | INTRAMUSCULAR | Status: DC | PRN

## 2013-02-04 MED ORDER — HYDROMORPHONE HCL PF 1 MG/ML IJ SOLN
0.2500 mg | INTRAMUSCULAR | Status: DC | PRN
  Administered 2013-02-04 (×4): 0.5 mg via INTRAVENOUS

## 2013-02-04 MED ORDER — LORAZEPAM 1 MG PO TABS
1.0000 mg | ORAL_TABLET | Freq: Four times a day (QID) | ORAL | Status: DC | PRN
  Administered 2013-02-05 – 2013-02-06 (×2): 1 mg via ORAL
  Filled 2013-02-04 (×2): qty 1

## 2013-02-04 MED ORDER — LORAZEPAM 2 MG/ML IJ SOLN
INTRAMUSCULAR | Status: AC
  Filled 2013-02-04: qty 1

## 2013-02-04 MED ORDER — CEFAZOLIN SODIUM-DEXTROSE 2-3 GM-% IV SOLR
2.0000 g | Freq: Once | INTRAVENOUS | Status: AC
  Administered 2013-02-04: 2 g via INTRAVENOUS

## 2013-02-04 MED ORDER — HYDROMORPHONE HCL PF 1 MG/ML IJ SOLN
INTRAMUSCULAR | Status: AC
  Filled 2013-02-04: qty 1

## 2013-02-04 MED ORDER — TETANUS-DIPHTH-ACELL PERTUSSIS 5-2.5-18.5 LF-MCG/0.5 IM SUSP: 0.5000 mL | Freq: Once | INTRAMUSCULAR | Status: DC

## 2013-02-04 MED ORDER — FENTANYL CITRATE 0.05 MG/ML IJ SOLN
INTRAMUSCULAR | Status: DC | PRN
  Administered 2013-02-04: 100 ug via INTRAVENOUS
  Administered 2013-02-04: 50 ug via INTRAVENOUS
  Administered 2013-02-04: 100 ug via INTRAVENOUS

## 2013-02-04 MED ORDER — SODIUM CHLORIDE 0.9 % IR SOLN
Status: DC | PRN
  Administered 2013-02-04 (×2)

## 2013-02-04 MED ORDER — OXYCODONE HCL 5 MG PO TABS: 5.0000 mg | ORAL_TABLET | Freq: Once | ORAL | Status: AC | PRN

## 2013-02-04 MED ORDER — 0.9 % SODIUM CHLORIDE (POUR BTL) OPTIME
TOPICAL | Status: DC | PRN
  Administered 2013-02-04: 1000 mL

## 2013-02-04 MED ORDER — SODIUM CHLORIDE 0.9 % IR SOLN
Status: DC | PRN
  Administered 2013-02-04: 9000 mL

## 2013-02-04 MED ORDER — LORAZEPAM 2 MG/ML IJ SOLN
1.0000 mg | Freq: Four times a day (QID) | INTRAMUSCULAR | Status: DC | PRN
  Administered 2013-02-04 – 2013-02-05 (×3): 1 mg via INTRAVENOUS
  Filled 2013-02-04 (×2): qty 1

## 2013-02-04 MED ORDER — CEFAZOLIN SODIUM-DEXTROSE 2-3 GM-% IV SOLR: 2.0000 g | Freq: Once | INTRAVENOUS | Status: AC

## 2013-02-04 MED ORDER — ROCURONIUM BROMIDE 100 MG/10ML IV SOLN
INTRAVENOUS | Status: DC | PRN
  Administered 2013-02-04: 30 mg via INTRAVENOUS
  Administered 2013-02-04: 50 mg via INTRAVENOUS

## 2013-02-04 MED ORDER — FENTANYL CITRATE 0.05 MG/ML IJ SOLN
50.0000 ug | Freq: Once | INTRAMUSCULAR | Status: AC
  Administered 2013-02-04: 50 ug via INTRAVENOUS

## 2013-02-04 MED ORDER — OXYCODONE HCL 5 MG/5ML PO SOLN: 5.0000 mg | Freq: Once | ORAL | Status: AC | PRN

## 2013-02-04 MED ORDER — IOHEXOL 300 MG/ML  SOLN: 100.0000 mL | Freq: Once | INTRAMUSCULAR | Status: AC | PRN

## 2013-02-04 MED ORDER — ALBUMIN HUMAN 5 % IV SOLN
INTRAVENOUS | Status: DC | PRN
  Administered 2013-02-04 (×2): via INTRAVENOUS

## 2013-02-04 MED ORDER — GLYCOPYRROLATE 0.2 MG/ML IJ SOLN
INTRAMUSCULAR | Status: DC | PRN
  Administered 2013-02-04: .6 mg via INTRAVENOUS

## 2013-02-04 MED ORDER — PROPOFOL 10 MG/ML IV BOLUS
INTRAVENOUS | Status: DC | PRN
  Administered 2013-02-04: 120 mg via INTRAVENOUS

## 2013-02-04 SURGICAL SUPPLY — 43 items
BAG DECANTER FOR FLEXI CONT (MISCELLANEOUS) ×2 IMPLANT
BLADE SURG ROTATE 9660 (MISCELLANEOUS) ×1 IMPLANT
CANISTER SUCTION 2500CC (MISCELLANEOUS) ×3 IMPLANT
CHLORAPREP W/TINT 26ML (MISCELLANEOUS) ×2 IMPLANT
CLOTH BEACON ORANGE TIMEOUT ST (SAFETY) ×3 IMPLANT
COVER SURGICAL LIGHT HANDLE (MISCELLANEOUS) ×4 IMPLANT
DRAPE LAPAROSCOPIC ABDOMINAL (DRAPES) ×4 IMPLANT
DRAPE UTILITY 15X26 W/TAPE STR (DRAPE) ×6 IMPLANT
DRAPE WARM FLUID 44X44 (DRAPE) ×3 IMPLANT
DRSG PAD ABDOMINAL 8X10 ST (GAUZE/BANDAGES/DRESSINGS) ×2 IMPLANT
ELECT BLADE 6.5 EXT (BLADE) ×1 IMPLANT
ELECT CAUTERY BLADE 6.4 (BLADE) ×3 IMPLANT
ELECT REM PT RETURN 9FT ADLT (ELECTROSURGICAL) ×3
ELECTRODE REM PT RTRN 9FT ADLT (ELECTROSURGICAL) ×2 IMPLANT
GLOVE BIOGEL M STRL SZ7.5 (GLOVE) ×5 IMPLANT
GLOVE BIOGEL PI IND STRL 8 (GLOVE) ×2 IMPLANT
GLOVE BIOGEL PI INDICATOR 8 (GLOVE) ×2
GOWN PREVENTION PLUS XLARGE (GOWN DISPOSABLE) ×4 IMPLANT
GOWN STRL NON-REIN LRG LVL3 (GOWN DISPOSABLE) ×5 IMPLANT
KIT BASIN OR (CUSTOM PROCEDURE TRAY) ×3 IMPLANT
KIT ROOM TURNOVER OR (KITS) ×3 IMPLANT
LIGASURE IMPACT 36 18CM CVD LR (INSTRUMENTS) IMPLANT
MARKER SKIN DUAL TIP RULER LAB (MISCELLANEOUS) ×1 IMPLANT
NS IRRIG 1000ML POUR BTL (IV SOLUTION) ×6 IMPLANT
PACK GENERAL/GYN (CUSTOM PROCEDURE TRAY) ×4 IMPLANT
PAD ARMBOARD 7.5X6 YLW CONV (MISCELLANEOUS) ×3 IMPLANT
SPECIMEN JAR LARGE (MISCELLANEOUS) IMPLANT
SPONGE GAUZE 4X4 12PLY (GAUZE/BANDAGES/DRESSINGS) ×2 IMPLANT
SPONGE LAP 18X18 X RAY DECT (DISPOSABLE) IMPLANT
STAPLER VISISTAT 35W (STAPLE) ×4 IMPLANT
SUCTION POOLE TIP (SUCTIONS) ×2 IMPLANT
SUT ETHILON 2 0 FS 18 (SUTURE) ×1 IMPLANT
SUT PDS AB 1 TP1 96 (SUTURE) ×6 IMPLANT
SUT SILK 2 0 SH CR/8 (SUTURE) ×3 IMPLANT
SUT SILK 2 0 TIES 10X30 (SUTURE) ×3 IMPLANT
SUT SILK 3 0 SH CR/8 (SUTURE) ×3 IMPLANT
SUT SILK 3 0 TIES 10X30 (SUTURE) ×3 IMPLANT
SUT VIC AB 3-0 SH 18 (SUTURE) ×2 IMPLANT
TAPE CLOTH SURG 4X10 WHT LF (GAUZE/BANDAGES/DRESSINGS) ×2 IMPLANT
TOWEL OR 17X26 10 PK STRL BLUE (TOWEL DISPOSABLE) ×3 IMPLANT
TRAY FOLEY CATH 14FRSI W/METER (CATHETERS) IMPLANT
WATER STERILE IRR 1000ML POUR (IV SOLUTION) IMPLANT
YANKAUER SUCT BULB TIP NO VENT (SUCTIONS) ×1 IMPLANT

## 2013-02-04 NOTE — ED Notes (Signed)
Pt to CT with RN, EMT, and Dr. Andrey Campanile.

## 2013-02-04 NOTE — ED Notes (Signed)
Pt in CT.

## 2013-02-04 NOTE — Anesthesia Procedure Notes (Signed)
Procedure Name: Intubation Date/Time: 02/04/2013 8:30 PM Performed by: Dene Nazir S Pre-anesthesia Checklist: Patient identified, Timeout performed, Emergency Drugs available, Suction available and Patient being monitored Patient Re-evaluated:Patient Re-evaluated prior to inductionOxygen Delivery Method: Circle system utilized Preoxygenation: Pre-oxygenation with 100% oxygen Intubation Type: IV induction Ventilation: Mask ventilation without difficulty Laryngoscope Size: Mac and 4 Grade View: Grade I Tube type: Oral Tube size: 7.5 mm Number of attempts: 1 Airway Equipment and Method: Stylet Placement Confirmation: ETT inserted through vocal cords under direct vision,  breath sounds checked- equal and bilateral and positive ETCO2 Secured at: 23 cm Tube secured with: Tape Dental Injury: Teeth and Oropharynx as per pre-operative assessment

## 2013-02-04 NOTE — Anesthesia Preprocedure Evaluation (Addendum)
Anesthesia Evaluation    Reviewed: Unable to perform ROS - Chart review only  Airway Mallampati: III TM Distance: >3 FB Neck ROM: Full    Dental   Pulmonary Current Smoker,    Pulmonary exam normal       Cardiovascular negative cardio ROS      Neuro/Psych negative neurological ROS     GI/Hepatic Neg liver ROS,   Endo/Other  negative endocrine ROS  Renal/GU negative Renal ROS     Musculoskeletal   Abdominal   Peds  Hematology   Anesthesia Other Findings   Reproductive/Obstetrics                          Anesthesia Physical Anesthesia Plan  ASA: III and emergent  Anesthesia Plan: General   Post-op Pain Management:    Induction: Intravenous  Airway Management Planned: Oral ETT  Additional Equipment:   Intra-op Plan:   Post-operative Plan: Extubation in OR  Informed Consent:   Plan Discussed with: CRNA, Anesthesiologist and Surgeon  Anesthesia Plan Comments:         Anesthesia Quick Evaluation
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
## 2013-02-04 NOTE — ED Notes (Signed)
Pt c/o pain to L side of back.  Denies abd pain/chest pain.  [employer] at bedside.
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
## 2013-02-04 NOTE — ED Notes (Signed)
Pt transported to OR by RN and EMT
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
## 2013-02-05 ENCOUNTER — Encounter (HOSPITAL_COMMUNITY): Payer: Self-pay | Admitting: *Deleted

## 2013-02-05 LAB — CBC
Platelets: 102 10*3/uL — ABNORMAL LOW (ref 150–400)
RDW: 14.1 % (ref 11.5–15.5)
WBC: 6.8 10*3/uL (ref 4.0–10.5)

## 2013-02-05 LAB — BASIC METABOLIC PANEL
Calcium: 7.2 mg/dL — ABNORMAL LOW (ref 8.4–10.5)
Creatinine, Ser: 0.7 mg/dL (ref 0.50–1.35)
GFR calc Af Amer: >90 mL/min (ref >90)
Sodium: 140 mEq/L (ref 135–145)

## 2013-02-05 LAB — MRSA PCR SCREENING: MRSA by PCR: NEGATIVE

## 2013-02-05 MED ORDER — PRO-STAT SUGAR FREE PO LIQD
30.0000 mL | Freq: Three times a day (TID) | ORAL | Status: DC
  Administered 2013-02-05: 30 mL via ORAL
  Filled 2013-02-05 (×5): qty 30

## 2013-02-05 MED ORDER — BOOST / RESOURCE BREEZE PO LIQD
1.0000 | Freq: Two times a day (BID) | ORAL | Status: DC
  Administered 2013-02-05: 1 via ORAL

## 2013-02-05 MED ORDER — MORPHINE SULFATE 2 MG/ML IJ SOLN
1.0000 mg | INTRAMUSCULAR | Status: DC | PRN
  Administered 2013-02-05 (×4): 2 mg via INTRAVENOUS
  Administered 2013-02-05 – 2013-02-06 (×3): 4 mg via INTRAVENOUS
  Filled 2013-02-05: qty 1
  Filled 2013-02-05: qty 2
  Filled 2013-02-05: qty 1
  Filled 2013-02-05: qty 2
  Filled 2013-02-05 (×2): qty 1
  Filled 2013-02-05: qty 2

## 2013-02-05 MED ORDER — DOCUSATE SODIUM 100 MG PO CAPS
100.0000 mg | ORAL_CAPSULE | Freq: Two times a day (BID) | ORAL | Status: DC
  Administered 2013-02-05 – 2013-02-06 (×3): 100 mg via ORAL
  Filled 2013-02-05 (×3): qty 1

## 2013-02-05 MED ORDER — PANTOPRAZOLE SODIUM 40 MG PO TBEC
40.0000 mg | DELAYED_RELEASE_TABLET | Freq: Every day | ORAL | Status: DC
  Administered 2013-02-05 – 2013-02-06 (×2): 40 mg via ORAL
  Filled 2013-02-05 (×2): qty 1

## 2013-02-05 MED ORDER — OXYCODONE HCL 5 MG PO TABS: 2.5000 mg | ORAL_TABLET | ORAL | Status: DC | PRN

## 2013-02-05 MED ORDER — OXYCODONE HCL 5 MG PO TABS: 5.0000 mg | ORAL_TABLET | ORAL | Status: DC | PRN

## 2013-02-05 MED ORDER — THIAMINE HCL 100 MG/ML IJ SOLN
100.0000 mg | Freq: Every day | INTRAMUSCULAR | Status: DC
  Filled 2013-02-05: qty 1

## 2013-02-05 MED ORDER — SPIRITUS FRUMENTI
1.0000 | Freq: Two times a day (BID) | ORAL | Status: DC
  Administered 2013-02-05 (×2): 1 via ORAL
  Filled 2013-02-05 (×4): qty 1

## 2013-02-05 MED ORDER — ADULT MULTIVITAMIN W/MINERALS CH
1.0000 | ORAL_TABLET | Freq: Every day | ORAL | Status: DC
  Administered 2013-02-05 – 2013-02-06 (×2): 1 via ORAL
  Filled 2013-02-05 (×2): qty 1

## 2013-02-05 MED ORDER — FOLIC ACID 1 MG PO TABS
1.0000 mg | ORAL_TABLET | Freq: Every day | ORAL | Status: DC
  Administered 2013-02-05 – 2013-02-06 (×2): 1 mg via ORAL
  Filled 2013-02-05 (×2): qty 1

## 2013-02-05 MED ORDER — OXYCODONE HCL 5 MG PO TABS
10.0000 mg | ORAL_TABLET | ORAL | Status: DC | PRN
  Administered 2013-02-05 – 2013-02-06 (×3): 10 mg via ORAL
  Filled 2013-02-05 (×3): qty 2

## 2013-02-05 MED ORDER — ONDANSETRON HCL 4 MG/2ML IJ SOLN: 4.0000 mg | Freq: Four times a day (QID) | INTRAMUSCULAR | Status: DC | PRN

## 2013-02-05 MED ORDER — POTASSIUM CHLORIDE IN NACL 20-0.9 MEQ/L-% IV SOLN
INTRAVENOUS | Status: DC
  Administered 2013-02-05: 75 mL/h via INTRAVENOUS
  Filled 2013-02-05 (×5): qty 1000

## 2013-02-05 MED ORDER — CEFAZOLIN SODIUM-DEXTROSE 2-3 GM-% IV SOLR
2.0000 g | Freq: Three times a day (TID) | INTRAVENOUS | Status: AC
  Administered 2013-02-05 (×3): 2 g via INTRAVENOUS
  Filled 2013-02-05 (×3): qty 50

## 2013-02-05 MED ORDER — ENOXAPARIN SODIUM 40 MG/0.4ML ~~LOC~~ SOLN
40.0000 mg | SUBCUTANEOUS | Status: DC
  Administered 2013-02-05: 40 mg via SUBCUTANEOUS
  Filled 2013-02-05 (×2): qty 0.4

## 2013-02-05 MED ORDER — ONDANSETRON HCL 4 MG PO TABS: 4.0000 mg | ORAL_TABLET | Freq: Four times a day (QID) | ORAL | Status: DC | PRN

## 2013-02-05 MED ORDER — PANTOPRAZOLE SODIUM 40 MG IV SOLR
40.0000 mg | Freq: Every day | INTRAVENOUS | Status: DC
  Filled 2013-02-05: qty 40

## 2013-02-05 MED ORDER — VITAMIN B-1 100 MG PO TABS
100.0000 mg | ORAL_TABLET | Freq: Every day | ORAL | Status: DC
  Administered 2013-02-05 – 2013-02-06 (×2): 100 mg via ORAL
  Filled 2013-02-05 (×2): qty 1
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
## 2013-02-06 LAB — CBC
Platelets: 110 10*3/uL — ABNORMAL LOW (ref 150–400)
RBC: 2.31 MIL/uL — ABNORMAL LOW (ref 4.22–5.81)
WBC: 5.7 10*3/uL (ref 4.0–10.5)

## 2013-02-06 MED ORDER — SODIUM CHLORIDE 0.9 % IJ SOLN
3.0000 mL | Freq: Two times a day (BID) | INTRAMUSCULAR | Status: DC
  Administered 2013-02-06: 3 mL via INTRAVENOUS

## 2013-02-06 MED ORDER — BACITRACIN ZINC 500 UNIT/GM EX OINT
TOPICAL_OINTMENT | Freq: Two times a day (BID) | CUTANEOUS | Status: DC
  Filled 2013-02-06: qty 15

## 2013-02-06 NOTE — Progress Notes (Signed)
Pt. Belligerent.  Does not want to stay and is threatening to leave AMA.  Dr. Andrey Campanile notified.  Pt. Notified as per MD that he would not receive prescriptions were he to leave before he was ready.  Pt. Agreed to stay for one more day.  Is very impulsive and has poor judgement related to safety issues.

## 2013-02-06 NOTE — Progress Notes (Signed)
Pt. Electing to leave AMA.  Dr. Andrey Campanile notified and patient notified of AMA conditions.

## 2013-02-06 NOTE — Progress Notes (Signed)
   CARE MANAGEMENT NOTE 02/06/2013  Patient:  Peter Howell, Peter Howell   Account Number:  [id number]  Date Initiated:  02/06/2013  Documentation initiated by:  [facility]  Subjective/Objective Assessment:     Action/Plan:   Anticipated DC Date:  02/06/2013   Anticipated DC Plan:  HOME/SELF CARE      DC Planning Services  CM consult  Medication Assistance      Choice offered to / List presented to:             Status of service:  Completed, signed off Medicare Important Message given?   (If response is "NO", the following Medicare IM given date fields will be blank) Date Medicare IM given:   Date Additional Medicare IM given:    Discharge Disposition:  HOME/SELF CARE  Per UR Regulation:    If discussed at Long Length of Stay Meetings, dates discussed:    Comments:  02/06/2013 1645 No NCM needs identified. No meds at dc or assistance needed. Isidoro Donning RN CCM Case Mgmt phone [phone]
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
## 2013-02-07 ENCOUNTER — Encounter (HOSPITAL_COMMUNITY): Payer: Self-pay | Admitting: General Surgery

## 2013-02-07 ENCOUNTER — Encounter (HOSPITAL_COMMUNITY): Payer: Self-pay | Admitting: Orthopedic Surgery

## 2013-02-07 MED FILL — Bacitracin-Polymyxin B Oint: CUTANEOUS | Qty: 14.17 | Status: AC

## 2013-02-08 ENCOUNTER — Emergency Department (HOSPITAL_COMMUNITY): Payer: No Typology Code available for payment source

## 2013-02-08 ENCOUNTER — Inpatient Hospital Stay (HOSPITAL_COMMUNITY)
Admission: EM | Admit: 2013-02-08 | Discharge: 2013-02-10 | DRG: 812 | Disposition: A | Discharge status: Home / Self Care | Payer: No Typology Code available for payment source | Attending: General Surgery | Admitting: General Surgery

## 2013-02-08 ENCOUNTER — Encounter (HOSPITAL_COMMUNITY): Payer: Self-pay | Admitting: Emergency Medicine

## 2013-02-08 DIAGNOSIS — S31119D Laceration without foreign body of abdominal wall, unspecified quadrant without penetration into peritoneal cavity, subsequent encounter: Secondary | ICD-10-CM

## 2013-02-08 DIAGNOSIS — D62 Acute posthemorrhagic anemia: Principal | ICD-10-CM

## 2013-02-08 DIAGNOSIS — S21119D Laceration without foreign body of unspecified front wall of thorax without penetration into thoracic cavity, subsequent encounter: Secondary | ICD-10-CM

## 2013-02-08 DIAGNOSIS — T798XXA Other early complications of trauma, initial encounter: Secondary | ICD-10-CM | POA: Diagnosis present

## 2013-02-08 DIAGNOSIS — S21219A Laceration without foreign body of unspecified back wall of thorax without penetration into thoracic cavity, initial encounter: Secondary | ICD-10-CM

## 2013-02-08 DIAGNOSIS — R079 Chest pain, unspecified: Secondary | ICD-10-CM

## 2013-02-08 DIAGNOSIS — S31119A Laceration without foreign body of abdominal wall, unspecified quadrant without penetration into peritoneal cavity, initial encounter: Secondary | ICD-10-CM

## 2013-02-08 DIAGNOSIS — S21119A Laceration without foreign body of unspecified front wall of thorax without penetration into thoracic cavity, initial encounter: Secondary | ICD-10-CM

## 2013-02-08 DIAGNOSIS — F1020 Alcohol dependence, uncomplicated: Secondary | ICD-10-CM

## 2013-02-08 DIAGNOSIS — D649 Anemia, unspecified: Secondary | ICD-10-CM

## 2013-02-08 DIAGNOSIS — F17200 Nicotine dependence, unspecified, uncomplicated: Secondary | ICD-10-CM | POA: Diagnosis present

## 2013-02-08 LAB — CBC
MCH: 32.8 pg (ref 26.0–34.0)
MCHC: 34.9 g/dL (ref 30.0–36.0)
MCV: 94.1 fL (ref 78.0–100.0)
Platelets: 174 10*3/uL (ref 150–400)
RDW: 13.9 % (ref 11.5–15.5)
WBC: 6.3 10*3/uL (ref 4.0–10.5)

## 2013-02-08 LAB — POCT I-STAT TROPONIN I

## 2013-02-08 MED ORDER — SODIUM CHLORIDE 0.9 % IV BOLUS (SEPSIS)
1000.0000 mL | Freq: Once | INTRAVENOUS | Status: AC
  Administered 2013-02-08: 1000 mL via INTRAVENOUS
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
## 2013-02-08 NOTE — ED Notes (Signed)
Lab in for blood work
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
## 2013-02-08 NOTE — ED Notes (Signed)
Patient transported to X-ray 

## 2013-02-09 ENCOUNTER — Inpatient Hospital Stay (HOSPITAL_COMMUNITY): Payer: No Typology Code available for payment source

## 2013-02-09 ENCOUNTER — Encounter (HOSPITAL_COMMUNITY): Payer: Self-pay | Admitting: Internal Medicine

## 2013-02-09 DIAGNOSIS — D62 Acute posthemorrhagic anemia: Principal | ICD-10-CM

## 2013-02-09 DIAGNOSIS — R079 Chest pain, unspecified: Secondary | ICD-10-CM

## 2013-02-09 DIAGNOSIS — F1020 Alcohol dependence, uncomplicated: Secondary | ICD-10-CM | POA: Diagnosis present

## 2013-02-09 DIAGNOSIS — D649 Anemia, unspecified: Secondary | ICD-10-CM

## 2013-02-09 LAB — RAPID URINE DRUG SCREEN, HOSP PERFORMED
Amphetamines: NOT DETECTED
Opiates: NOT DETECTED
Tetrahydrocannabinol: NOT DETECTED

## 2013-02-09 LAB — COMPREHENSIVE METABOLIC PANEL
ALT: 48 U/L (ref 0–53)
AST: 63 U/L — ABNORMAL HIGH (ref 0–37)
AST: 72 U/L — ABNORMAL HIGH (ref 0–37)
Albumin: 2.7 g/dL — ABNORMAL LOW (ref 3.5–5.2)
CO2: 24 mEq/L (ref 19–32)
Calcium: 8.2 mg/dL — ABNORMAL LOW (ref 8.4–10.5)
Calcium: 8 mg/dL — ABNORMAL LOW (ref 8.4–10.5)
Chloride: 101 mEq/L (ref 96–112)
Chloride: 105 mEq/L (ref 96–112)
Creatinine, Ser: 0.7 mg/dL (ref 0.50–1.35)
Creatinine, Ser: 0.65 mg/dL (ref 0.50–1.35)
GFR calc Af Amer: >90 mL/min (ref >90)
GFR calc non Af Amer: >90 mL/min (ref >90)
Glucose, Bld: 95 mg/dL (ref 70–99)
Sodium: 140 mEq/L (ref 135–145)
Total Bilirubin: 0.7 mg/dL (ref 0.3–1.2)
Total Protein: 5.6 g/dL — ABNORMAL LOW (ref 6.0–8.3)

## 2013-02-09 LAB — PREPARE RBC (CROSSMATCH)

## 2013-02-09 LAB — ABO/RH: ABO/RH(D): A POS

## 2013-02-09 MED ORDER — LORAZEPAM 2 MG/ML IJ SOLN
0.0000 mg | Freq: Two times a day (BID) | INTRAMUSCULAR | Status: DC
  Filled 2013-02-09: qty 1

## 2013-02-09 MED ORDER — MORPHINE SULFATE 2 MG/ML IJ SOLN
2.0000 mg | INTRAMUSCULAR | Status: DC | PRN
Start: 2013-02-09 — End: 2013-02-10

## 2013-02-09 MED ORDER — VITAMIN B-1 100 MG PO TABS: 100.0000 mg | ORAL_TABLET | Freq: Every day | ORAL | Status: DC

## 2013-02-09 MED ORDER — VITAMIN B-1 100 MG PO TABS
100.0000 mg | ORAL_TABLET | Freq: Every day | ORAL | Status: DC
  Administered 2013-02-09 – 2013-02-10 (×2): 100 mg via ORAL
  Filled 2013-02-09 (×2): qty 1

## 2013-02-09 MED ORDER — FLEET ENEMA 7-19 GM/118ML RE ENEM
1.0000 | ENEMA | Freq: Every day | RECTAL | Status: DC | PRN
  Filled 2013-02-09: qty 1

## 2013-02-09 MED ORDER — PNEUMOCOCCAL VAC POLYVALENT 25 MCG/0.5ML IJ INJ
0.5000 mL | INJECTION | INTRAMUSCULAR | Status: AC
  Administered 2013-02-10: 0.5 mL via INTRAMUSCULAR
  Filled 2013-02-09: qty 0.5

## 2013-02-09 MED ORDER — FOLIC ACID 1 MG PO TABS
1.0000 mg | ORAL_TABLET | Freq: Every day | ORAL | Status: DC
  Administered 2013-02-09 – 2013-02-10 (×2): 1 mg via ORAL
  Filled 2013-02-09 (×2): qty 1

## 2013-02-09 MED ORDER — LORAZEPAM 0.5 MG PO TABS: 1.0000 mg | ORAL_TABLET | Freq: Four times a day (QID) | ORAL | Status: DC | PRN

## 2013-02-09 MED ORDER — THIAMINE HCL 100 MG/ML IJ SOLN
100.0000 mg | Freq: Every day | INTRAMUSCULAR | Status: DC
  Filled 2013-02-09 (×2): qty 1

## 2013-02-09 MED ORDER — DOCUSATE SODIUM 100 MG PO CAPS
100.0000 mg | ORAL_CAPSULE | Freq: Two times a day (BID) | ORAL | Status: DC
  Administered 2013-02-09 – 2013-02-10 (×2): 100 mg via ORAL
  Filled 2013-02-09 (×4): qty 1

## 2013-02-09 MED ORDER — BISACODYL 10 MG RE SUPP: 10.0000 mg | Freq: Every day | RECTAL | Status: DC | PRN

## 2013-02-09 MED ORDER — PANTOPRAZOLE SODIUM 40 MG IV SOLR: 40.0000 mg | INTRAVENOUS | Status: DC

## 2013-02-09 MED ORDER — ACETAMINOPHEN 325 MG PO TABS: 650.0000 mg | ORAL_TABLET | Freq: Four times a day (QID) | ORAL | Status: DC | PRN

## 2013-02-09 MED ORDER — NAPROXEN 500 MG PO TABS
500.0000 mg | ORAL_TABLET | Freq: Two times a day (BID) | ORAL | Status: DC
  Administered 2013-02-09 – 2013-02-10 (×2): 500 mg via ORAL
  Filled 2013-02-09 (×4): qty 1

## 2013-02-09 MED ORDER — THIAMINE HCL 100 MG/ML IJ SOLN: 100.0000 mg | Freq: Every day | INTRAMUSCULAR | Status: DC

## 2013-02-09 MED ORDER — PANTOPRAZOLE SODIUM 40 MG IV SOLR
40.0000 mg | Freq: Every day | INTRAVENOUS | Status: DC
  Administered 2013-02-09: 40 mg via INTRAVENOUS
  Filled 2013-02-09 (×2): qty 40

## 2013-02-09 MED ORDER — IOHEXOL 300 MG/ML  SOLN
100.0000 mL | Freq: Once | INTRAMUSCULAR | Status: AC | PRN
  Administered 2013-02-09: 100 mL via INTRAVENOUS

## 2013-02-09 MED ORDER — SODIUM CHLORIDE 0.9 % IJ SOLN
3.0000 mL | Freq: Two times a day (BID) | INTRAMUSCULAR | Status: DC
  Administered 2013-02-09 (×3): 3 mL via INTRAVENOUS

## 2013-02-09 MED ORDER — ONDANSETRON HCL 4 MG PO TABS: 4.0000 mg | ORAL_TABLET | Freq: Four times a day (QID) | ORAL | Status: DC | PRN

## 2013-02-09 MED ORDER — ACETAMINOPHEN 650 MG RE SUPP: 650.0000 mg | Freq: Four times a day (QID) | RECTAL | Status: DC | PRN

## 2013-02-09 MED ORDER — ONDANSETRON HCL 4 MG/2ML IJ SOLN: 4.0000 mg | Freq: Four times a day (QID) | INTRAMUSCULAR | Status: DC | PRN

## 2013-02-09 MED ORDER — LORAZEPAM 2 MG/ML IJ SOLN
0.0000 mg | Freq: Four times a day (QID) | INTRAMUSCULAR | Status: DC
  Administered 2013-02-09 – 2013-02-10 (×4): 2 mg via INTRAVENOUS
  Filled 2013-02-09 (×3): qty 1

## 2013-02-09 MED ORDER — TRAMADOL HCL 50 MG PO TABS
100.0000 mg | ORAL_TABLET | Freq: Four times a day (QID) | ORAL | Status: DC
  Administered 2013-02-09 – 2013-02-10 (×5): 100 mg via ORAL
  Filled 2013-02-09: qty 1
  Filled 2013-02-09 (×7): qty 2
  Filled 2013-02-09: qty 1

## 2013-02-09 MED ORDER — HYDROCODONE-ACETAMINOPHEN 10-325 MG PO TABS
0.5000 | ORAL_TABLET | ORAL | Status: DC | PRN
  Administered 2013-02-09: 1 via ORAL
  Filled 2013-02-09: qty 1

## 2013-02-09 MED ORDER — LORAZEPAM 2 MG/ML IJ SOLN
1.0000 mg | Freq: Four times a day (QID) | INTRAMUSCULAR | Status: DC | PRN
  Administered 2013-02-09 – 2013-02-10 (×2): 1 mg via INTRAVENOUS
  Filled 2013-02-09 (×2): qty 1

## 2013-02-09 MED ORDER — SODIUM CHLORIDE 0.9 % IV SOLN
INTRAVENOUS | Status: DC
Start: 2013-02-09 — End: 2013-02-09
  Administered 2013-02-09: 05:00:00 via INTRAVENOUS

## 2013-02-09 MED ORDER — BACITRACIN ZINC 500 UNIT/GM EX OINT
TOPICAL_OINTMENT | Freq: Two times a day (BID) | CUTANEOUS | Status: DC
  Administered 2013-02-09 (×2): via TOPICAL
  Administered 2013-02-10: 1 via TOPICAL
  Filled 2013-02-09: qty 15

## 2013-02-09 MED ORDER — ADULT MULTIVITAMIN W/MINERALS CH
1.0000 | ORAL_TABLET | Freq: Every day | ORAL | Status: DC
  Administered 2013-02-09 – 2013-02-10 (×2): 1 via ORAL
  Filled 2013-02-09 (×2): qty 1

## 2013-02-09 MED ORDER — POLYETHYLENE GLYCOL 3350 17 G PO PACK
17.0000 g | PACK | Freq: Every day | ORAL | Status: DC
  Administered 2013-02-09 – 2013-02-10 (×2): 17 g via ORAL
  Filled 2013-02-09 (×2): qty 1

## 2013-02-09 MED ORDER — SPIRITUS FRUMENTI
1.0000 | Freq: Three times a day (TID) | ORAL | Status: DC
  Administered 2013-02-09 – 2013-02-10 (×4): 1 via ORAL
  Filled 2013-02-09 (×10): qty 1
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
## 2013-02-10 LAB — BASIC METABOLIC PANEL
CO2: 25 mEq/L (ref 19–32)
Calcium: 7.9 mg/dL — ABNORMAL LOW (ref 8.4–10.5)
Creatinine, Ser: 0.7 mg/dL (ref 0.50–1.35)
GFR calc non Af Amer: >90 mL/min (ref >90)
Glucose, Bld: 111 mg/dL — ABNORMAL HIGH (ref 70–99)
Sodium: 140 mEq/L (ref 135–145)

## 2013-02-10 LAB — CBC
Hemoglobin: 9 g/dL — ABNORMAL LOW (ref 13.0–17.0)
MCH: 32.3 pg (ref 26.0–34.0)
MCHC: 34.1 g/dL (ref 30.0–36.0)
MCV: 94.6 fL (ref 78.0–100.0)
RBC: 2.79 MIL/uL — ABNORMAL LOW (ref 4.22–5.81)

## 2013-02-10 MED ORDER — NAPROXEN 500 MG PO TABS: 500.0000 mg | ORAL_TABLET | Freq: Two times a day (BID) | ORAL | Status: DC

## 2013-02-10 MED ORDER — TRAMADOL-ACETAMINOPHEN 37.5-325 MG PO TABS: 1.0000 | ORAL_TABLET | Freq: Four times a day (QID) | ORAL | Status: DC | PRN
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
## 2013-02-10 NOTE — ED Provider Notes (Signed)
Medical screening examination/treatment/procedure(s) were conducted as a shared visit with non-physician practitioner(s) and myself.  I personally evaluated the patient during the encounter   Loren Racer, MD 02/10/13 [phone]
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
## 2013-02-11 ENCOUNTER — Telehealth (HOSPITAL_COMMUNITY): Payer: Self-pay | Admitting: Emergency Medicine

## 2013-02-11 LAB — TYPE AND SCREEN
ABO/RH(D): A POS
Antibody Screen: NEGATIVE
Unit division: 0

## 2013-02-14 NOTE — Telephone Encounter (Signed)
Sister (not niece) needed to resched appt due to work issues. She cannot bring him Friday. I suggested that if she absolutely couldn't do it that she could take him to an urgent care for eval and suture/staple removal. She will plan on that and will call back if she needs [place] to see him.

## 2013-02-18 ENCOUNTER — Encounter (INDEPENDENT_AMBULATORY_CARE_PROVIDER_SITE_OTHER): Payer: Self-pay

## 2013-02-20 ENCOUNTER — Encounter (HOSPITAL_COMMUNITY): Payer: Self-pay

## 2013-02-20 ENCOUNTER — Emergency Department (HOSPITAL_COMMUNITY): Payer: Self-pay

## 2013-02-20 ENCOUNTER — Emergency Department (HOSPITAL_COMMUNITY)
Admission: EM | Admit: 2013-02-20 | Discharge: 2013-02-20 | Disposition: A | Discharge status: Home / Self Care | Payer: Self-pay | Attending: Emergency Medicine | Admitting: Emergency Medicine

## 2013-02-20 DIAGNOSIS — R109 Unspecified abdominal pain: Secondary | ICD-10-CM | POA: Insufficient documentation

## 2013-02-20 DIAGNOSIS — F17200 Nicotine dependence, unspecified, uncomplicated: Secondary | ICD-10-CM | POA: Insufficient documentation

## 2013-02-20 DIAGNOSIS — R002 Palpitations: Secondary | ICD-10-CM | POA: Insufficient documentation

## 2013-02-20 LAB — COMPREHENSIVE METABOLIC PANEL
ALT: 35 U/L (ref 0–53)
Albumin: 3.7 g/dL (ref 3.5–5.2)
Calcium: 9 mg/dL (ref 8.4–10.5)
GFR calc Af Amer: >90 mL/min (ref >90)
Glucose, Bld: 77 mg/dL (ref 70–99)
Potassium: 3.5 mEq/L (ref 3.5–5.1)
Sodium: 139 mEq/L (ref 135–145)
Total Protein: 7.4 g/dL (ref 6.0–8.3)

## 2013-02-20 LAB — CBC WITH DIFFERENTIAL/PLATELET
Basophils Absolute: 0.1 10*3/uL (ref 0.0–0.1)
Basophils Relative: 1 % (ref 0–1)
Eosinophils Absolute: 0 10*3/uL (ref 0.0–0.7)
Eosinophils Relative: 0 % (ref 0–5)
MCH: 31.5 pg (ref 26.0–34.0)
MCHC: 33.9 g/dL (ref 30.0–36.0)
MCV: 92.7 fL (ref 78.0–100.0)
Neutrophils Relative %: 67 % (ref 43–77)
Platelets: 545 10*3/uL — ABNORMAL HIGH (ref 150–400)
RDW: 14.3 % (ref 11.5–15.5)

## 2013-02-20 MED ORDER — HYDROCODONE-ACETAMINOPHEN 5-325 MG PO TABS: 1.0000 | ORAL_TABLET | Freq: Four times a day (QID) | ORAL | Status: DC | PRN

## 2013-02-20 MED ORDER — SODIUM CHLORIDE 0.9 % IV BOLUS (SEPSIS)
1000.0000 mL | Freq: Once | INTRAVENOUS | Status: AC
  Administered 2013-02-20: 1000 mL via INTRAVENOUS

## 2013-02-20 MED ORDER — HYDROMORPHONE HCL PF 1 MG/ML IJ SOLN
0.5000 mg | Freq: Once | INTRAMUSCULAR | Status: AC
  Administered 2013-02-20: 0.5 mg via INTRAVENOUS
  Filled 2013-02-20: qty 1

## 2013-02-20 MED ORDER — IOHEXOL 300 MG/ML  SOLN
100.0000 mL | Freq: Once | INTRAMUSCULAR | Status: AC | PRN
  Administered 2013-02-20: 100 mL via INTRAVENOUS

## 2013-02-20 MED ORDER — IOHEXOL 300 MG/ML  SOLN
50.0000 mL | Freq: Once | INTRAMUSCULAR | Status: AC | PRN
  Administered 2013-02-20: 50 mL via ORAL

## 2013-02-20 MED ORDER — ONDANSETRON HCL 4 MG/2ML IJ SOLN
4.0000 mg | Freq: Once | INTRAMUSCULAR | Status: AC
  Administered 2013-02-20: 4 mg via INTRAVENOUS
  Filled 2013-02-20: qty 2

## 2013-02-20 NOTE — ED Notes (Signed)
Patient transported to CT 
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
## 2013-02-22 ENCOUNTER — Telehealth (HOSPITAL_COMMUNITY): Payer: Self-pay | Admitting: Emergency Medicine

## 2013-02-22 NOTE — Telephone Encounter (Signed)
Scheduled appt for Friday 

## 2013-02-25 ENCOUNTER — Ambulatory Visit (INDEPENDENT_AMBULATORY_CARE_PROVIDER_SITE_OTHER): Payer: Self-pay | Admitting: Orthopedic Surgery

## 2013-02-25 ENCOUNTER — Encounter (INDEPENDENT_AMBULATORY_CARE_PROVIDER_SITE_OTHER): Payer: Self-pay

## 2013-02-25 VITALS — BP 144/96 | HR 86 | Temp 99.4°F | Resp 18 | Ht 69.0 in | Wt 191.0 lb

## 2013-02-25 DIAGNOSIS — S21212D Laceration without foreign body of left back wall of thorax without penetration into thoracic cavity, subsequent encounter: Secondary | ICD-10-CM

## 2013-02-25 DIAGNOSIS — S21112D Laceration without foreign body of left front wall of thorax without penetration into thoracic cavity, subsequent encounter: Secondary | ICD-10-CM

## 2013-02-25 DIAGNOSIS — Z5189 Encounter for other specified aftercare: Secondary | ICD-10-CM
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
## 2013-06-04 ENCOUNTER — Emergency Department (HOSPITAL_COMMUNITY): Payer: Self-pay

## 2013-06-04 ENCOUNTER — Encounter (HOSPITAL_COMMUNITY): Payer: Self-pay | Admitting: Emergency Medicine

## 2013-06-04 ENCOUNTER — Emergency Department (HOSPITAL_COMMUNITY)
Admission: EM | Admit: 2013-06-04 | Discharge: 2013-06-04 | Disposition: A | Discharge status: Home / Self Care | Payer: Self-pay | Attending: Emergency Medicine | Admitting: Emergency Medicine

## 2013-06-04 DIAGNOSIS — R0602 Shortness of breath: Secondary | ICD-10-CM | POA: Insufficient documentation

## 2013-06-04 DIAGNOSIS — S3981XA Other specified injuries of abdomen, initial encounter: Secondary | ICD-10-CM | POA: Insufficient documentation

## 2013-06-04 DIAGNOSIS — Z5900 Homelessness unspecified: Secondary | ICD-10-CM | POA: Insufficient documentation

## 2013-06-04 DIAGNOSIS — S2239XA Fracture of one rib, unspecified side, initial encounter for closed fracture: Secondary | ICD-10-CM | POA: Insufficient documentation

## 2013-06-04 DIAGNOSIS — X58XXXA Exposure to other specified factors, initial encounter: Secondary | ICD-10-CM | POA: Insufficient documentation

## 2013-06-04 DIAGNOSIS — Y929 Unspecified place or not applicable: Secondary | ICD-10-CM | POA: Insufficient documentation

## 2013-06-04 DIAGNOSIS — Y939 Activity, unspecified: Secondary | ICD-10-CM | POA: Insufficient documentation

## 2013-06-04 DIAGNOSIS — R5381 Other malaise: Secondary | ICD-10-CM | POA: Insufficient documentation

## 2013-06-04 DIAGNOSIS — F1020 Alcohol dependence, uncomplicated: Secondary | ICD-10-CM

## 2013-06-04 DIAGNOSIS — F10929 Alcohol use, unspecified with intoxication, unspecified: Secondary | ICD-10-CM

## 2013-06-04 DIAGNOSIS — F10229 Alcohol dependence with intoxication, unspecified: Secondary | ICD-10-CM | POA: Insufficient documentation

## 2013-06-04 DIAGNOSIS — S2232XA Fracture of one rib, left side, initial encounter for closed fracture: Secondary | ICD-10-CM

## 2013-06-04 DIAGNOSIS — F17200 Nicotine dependence, unspecified, uncomplicated: Secondary | ICD-10-CM | POA: Insufficient documentation

## 2013-06-04 LAB — COMPREHENSIVE METABOLIC PANEL
BUN: 6 mg/dL (ref 6–23)
CO2: 28 mEq/L (ref 19–32)
Chloride: 106 mEq/L (ref 96–112)
Creatinine, Ser: 0.59 mg/dL (ref 0.50–1.35)
GFR calc non Af Amer: >90 mL/min (ref >90)
Total Bilirubin: 0.1 mg/dL — ABNORMAL LOW (ref 0.3–1.2)

## 2013-06-04 LAB — CBC WITH DIFFERENTIAL/PLATELET
Basophils Relative: 1 % (ref 0–1)
Eosinophils Relative: 4 % (ref 0–5)
HCT: 37.4 % — ABNORMAL LOW (ref 39.0–52.0)
Hemoglobin: 12.2 g/dL — ABNORMAL LOW (ref 13.0–17.0)
Lymphocytes Relative: 46 % (ref 12–46)
MCHC: 32.6 g/dL (ref 30.0–36.0)
MCV: 80.6 fL (ref 78.0–100.0)
Monocytes Absolute: 0.4 10*3/uL (ref 0.1–1.0)
Monocytes Relative: 7 % (ref 3–12)
Neutro Abs: 2.4 10*3/uL (ref 1.7–7.7)

## 2013-06-04 LAB — CG4 I-STAT (LACTIC ACID): Lactic Acid, Venous: 1.63 mmol/L (ref 0.5–2.2)

## 2013-06-04 LAB — SAMPLE TO BLOOD BANK

## 2013-06-04 MED ORDER — SODIUM CHLORIDE 0.9 % IV SOLN
1000.0000 mL | INTRAVENOUS | Status: DC
  Administered 2013-06-04: 1000 mL via INTRAVENOUS

## 2013-06-04 MED ORDER — LORAZEPAM 2 MG/ML IJ SOLN
1.0000 mg | Freq: Once | INTRAMUSCULAR | Status: AC
  Administered 2013-06-04: 1 mg via INTRAVENOUS
  Filled 2013-06-04: qty 1

## 2013-06-04 MED ORDER — SODIUM CHLORIDE 0.9 % IV SOLN
1000.0000 mL | Freq: Once | INTRAVENOUS | Status: AC
  Administered 2013-06-04: 1000 mL via INTRAVENOUS

## 2013-06-04 MED ORDER — SODIUM CHLORIDE 0.9 % IV SOLN: 1000.0000 mL | Freq: Once | INTRAVENOUS | Status: DC

## 2013-06-04 MED ORDER — SODIUM CHLORIDE 0.9 % IV BOLUS (SEPSIS)
1000.0000 mL | Freq: Once | INTRAVENOUS | Status: AC
  Administered 2013-06-04: 1000 mL via INTRAVENOUS

## 2013-06-04 MED ORDER — MORPHINE SULFATE 4 MG/ML IJ SOLN
4.0000 mg | Freq: Once | INTRAMUSCULAR | Status: AC
  Administered 2013-06-04: 4 mg via INTRAVENOUS
  Filled 2013-06-04: qty 1
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
## 2013-06-04 NOTE — ED Notes (Signed)
Dr Rubin Payor notified of pt low BP.

## 2013-06-04 NOTE — ED Notes (Signed)
Patient transported to X-ray 

## 2013-06-04 NOTE — ED Notes (Signed)
Bed: WA17 Expected date: 06/04/13 Expected time: 12:06 PM Means of arrival: Ambulance Comments: Chest Pain

## 2013-06-04 NOTE — ED Notes (Signed)
Pt states that [place] he is homeless and friends called EMS because "they couldn't get me up". Pt keeps requesting "I want a beer, go across the street and get one." Pt speech slurred, but is A&O. Pt keeps c/o CP that has been off/on since "June of this year." Pt is A&O and in NAD

## 2013-08-04 ENCOUNTER — Other Ambulatory Visit: Payer: Self-pay

## 2014-04-03 ENCOUNTER — Encounter (HOSPITAL_COMMUNITY): Payer: Self-pay | Admitting: Emergency Medicine

## 2014-04-03 ENCOUNTER — Emergency Department (HOSPITAL_COMMUNITY): Payer: No Typology Code available for payment source

## 2014-04-03 ENCOUNTER — Emergency Department (HOSPITAL_COMMUNITY)
Admission: EM | Admit: 2014-04-03 | Discharge: 2014-04-04 | Disposition: A | Discharge status: Home / Self Care | Payer: No Typology Code available for payment source | Attending: Emergency Medicine | Admitting: Emergency Medicine

## 2014-04-03 DIAGNOSIS — Z9289 Personal history of other medical treatment: Secondary | ICD-10-CM

## 2014-04-03 DIAGNOSIS — F1010 Alcohol abuse, uncomplicated: Secondary | ICD-10-CM | POA: Diagnosis present

## 2014-04-03 DIAGNOSIS — F329 Major depressive disorder, single episode, unspecified: Secondary | ICD-10-CM | POA: Insufficient documentation

## 2014-04-03 DIAGNOSIS — D62 Acute posthemorrhagic anemia: Secondary | ICD-10-CM

## 2014-04-03 DIAGNOSIS — F1410 Cocaine abuse, uncomplicated: Secondary | ICD-10-CM | POA: Insufficient documentation

## 2014-04-03 DIAGNOSIS — R05 Cough: Secondary | ICD-10-CM | POA: Insufficient documentation

## 2014-04-03 DIAGNOSIS — R059 Cough, unspecified: Secondary | ICD-10-CM | POA: Insufficient documentation

## 2014-04-03 DIAGNOSIS — F17200 Nicotine dependence, unspecified, uncomplicated: Secondary | ICD-10-CM | POA: Insufficient documentation

## 2014-04-03 DIAGNOSIS — Z09 Encounter for follow-up examination after completed treatment for conditions other than malignant neoplasm: Secondary | ICD-10-CM

## 2014-04-03 DIAGNOSIS — F1020 Alcohol dependence, uncomplicated: Secondary | ICD-10-CM | POA: Insufficient documentation

## 2014-04-03 DIAGNOSIS — Z5900 Homelessness unspecified: Secondary | ICD-10-CM | POA: Insufficient documentation

## 2014-04-03 DIAGNOSIS — R079 Chest pain, unspecified: Secondary | ICD-10-CM | POA: Insufficient documentation

## 2014-04-03 DIAGNOSIS — F3289 Other specified depressive episodes: Secondary | ICD-10-CM | POA: Insufficient documentation

## 2014-04-03 LAB — CBC WITH DIFFERENTIAL/PLATELET
Basophils Absolute: 0.1 10*3/uL (ref 0.0–0.1)
Basophils Relative: 1 % (ref 0–1)
EOS ABS: 0 10*3/uL (ref 0.0–0.7)
Eosinophils Relative: 1 % (ref 0–5)
HEMATOCRIT: 45.3 % (ref 39.0–52.0)
HEMOGLOBIN: 15.9 g/dL (ref 13.0–17.0)
LYMPHS ABS: 2.5 10*3/uL (ref 0.7–4.0)
Lymphocytes Relative: 43 % (ref 12–46)
MCH: 32.6 pg (ref 26.0–34.0)
MCHC: 35.1 g/dL (ref 30.0–36.0)
MCV: 93 fL (ref 78.0–100.0)
Monocytes Absolute: 0.7 10*3/uL (ref 0.1–1.0)
Monocytes Relative: 12 % (ref 3–12)
NEUTROS ABS: 2.5 10*3/uL (ref 1.7–7.7)
NEUTROS PCT: 43 % (ref 43–77)
Platelets: 155 10*3/uL (ref 150–400)
RBC: 4.87 MIL/uL (ref 4.22–5.81)
RDW: 13.8 % (ref 11.5–15.5)
WBC: 5.7 10*3/uL (ref 4.0–10.5)

## 2014-04-03 LAB — COMPREHENSIVE METABOLIC PANEL
ALBUMIN: 3.9 g/dL (ref 3.5–5.2)
ALK PHOS: 111 U/L (ref 39–117)
ALT: 107 U/L — AB (ref 0–53)
AST: 138 U/L — ABNORMAL HIGH (ref 0–37)
Anion gap: 18 — ABNORMAL HIGH (ref 5–15)
BUN: 5 mg/dL — ABNORMAL LOW (ref 6–23)
CO2: 22 mEq/L (ref 19–32)
Calcium: 8.9 mg/dL (ref 8.4–10.5)
Chloride: 98 mEq/L (ref 96–112)
Creatinine, Ser: 0.75 mg/dL (ref 0.50–1.35)
GFR calc non Af Amer: >90 mL/min (ref >90)
GLUCOSE: 86 mg/dL (ref 70–99)
POTASSIUM: 3.6 meq/L — AB (ref 3.7–5.3)
SODIUM: 138 meq/L (ref 137–147)
TOTAL PROTEIN: 7.9 g/dL (ref 6.0–8.3)
Total Bilirubin: 0.4 mg/dL (ref 0.3–1.2)

## 2014-04-03 LAB — RAPID URINE DRUG SCREEN, HOSP PERFORMED
Amphetamines: NOT DETECTED
BARBITURATES: NOT DETECTED
BENZODIAZEPINES: NOT DETECTED
COCAINE: POSITIVE — AB
Opiates: NOT DETECTED
TETRAHYDROCANNABINOL: NOT DETECTED

## 2014-04-03 LAB — ETHANOL: Alcohol, Ethyl (B): 293 mg/dL — ABNORMAL HIGH (ref 0–11)

## 2014-04-03 LAB — I-STAT TROPONIN, ED: Troponin i, poc: 0 ng/mL (ref 0.00–0.08)

## 2014-04-03 MED ORDER — LORAZEPAM 1 MG PO TABS: 0.0000 mg | ORAL_TABLET | Freq: Two times a day (BID) | ORAL | Status: DC

## 2014-04-03 MED ORDER — THIAMINE HCL 100 MG/ML IJ SOLN
100.0000 mg | Freq: Every day | INTRAMUSCULAR | Status: DC
  Filled 2014-04-03 (×2): qty 2

## 2014-04-03 MED ORDER — VITAMIN B-1 100 MG PO TABS
100.0000 mg | ORAL_TABLET | Freq: Every day | ORAL | Status: DC
  Administered 2014-04-03 – 2014-04-04 (×2): 100 mg via ORAL
  Filled 2014-04-03 (×2): qty 1

## 2014-04-03 MED ORDER — LORAZEPAM 1 MG PO TABS
0.0000 mg | ORAL_TABLET | Freq: Four times a day (QID) | ORAL | Status: DC
  Administered 2014-04-04 (×4): 1 mg via ORAL
  Filled 2014-04-03 (×4): qty 1

## 2014-04-03 NOTE — ED Notes (Signed)
Pt requesting detox from alcohol and c/o chest pain from coughing.
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
## 2014-04-04 ENCOUNTER — Observation Stay (HOSPITAL_COMMUNITY)
Admission: EM | Admit: 2014-04-04 | Payer: No Typology Code available for payment source | Source: Intra-hospital | Admitting: Psychiatry

## 2014-04-04 DIAGNOSIS — Z09 Encounter for follow-up examination after completed treatment for conditions other than malignant neoplasm: Secondary | ICD-10-CM

## 2014-04-04 DIAGNOSIS — F1010 Alcohol abuse, uncomplicated: Secondary | ICD-10-CM

## 2014-04-04 DIAGNOSIS — Z9289 Personal history of other medical treatment: Secondary | ICD-10-CM

## 2014-04-04 LAB — ETHANOL: Alcohol, Ethyl (B): <11 mg/dL (ref 0–11)

## 2014-04-04 MED ORDER — ACETAMINOPHEN 325 MG PO TABS: 650.0000 mg | ORAL_TABLET | ORAL | Status: DC | PRN

## 2014-04-04 MED ORDER — NICOTINE 21 MG/24HR TD PT24
21.0000 mg | MEDICATED_PATCH | Freq: Every day | TRANSDERMAL | Status: DC
  Administered 2014-04-04: 21 mg via TRANSDERMAL
  Filled 2014-04-04: qty 1

## 2014-04-04 MED ORDER — LORAZEPAM 1 MG PO TABS: 1.0000 mg | ORAL_TABLET | Freq: Three times a day (TID) | ORAL | Status: DC | PRN

## 2014-04-04 MED ORDER — ALUM & MAG HYDROXIDE-SIMETH 200-200-20 MG/5ML PO SUSP: 30.0000 mL | ORAL | Status: DC | PRN

## 2014-04-04 MED ORDER — IBUPROFEN 200 MG PO TABS: 600.0000 mg | ORAL_TABLET | Freq: Three times a day (TID) | ORAL | Status: DC | PRN

## 2014-04-04 MED ORDER — ONDANSETRON HCL 4 MG PO TABS: 4.0000 mg | ORAL_TABLET | Freq: Three times a day (TID) | ORAL | Status: DC | PRN

## 2014-04-04 MED ORDER — ZOLPIDEM TARTRATE 5 MG PO TABS: 5.0000 mg | ORAL_TABLET | Freq: Every evening | ORAL | Status: DC | PRN

## 2014-04-04 NOTE — Consult Note (Signed)
[facility] Face-to-Face Psychiatry Consult   Reason for Consult:  Requesting Alcohol Detox Referring Physician:  EDP  Peter Howell is an 53 y.o. male. Total Time spent with patient: 45 minutes  Assessment: AXIS I:  Alcohol Abuse AXIS II:  Deferred AXIS III:  History reviewed. No pertinent past medical history. AXIS IV:  other psychosocial or environmental problems AXIS V:  51-60 moderate symptoms  Plan:  No evidence of imminent risk to self or others at present.   Recommend over night observation  Subjective:   Peter Howell is a 53 y.o. male patient admitted with Alcohol abuse; and Alcohol Detox.  HPI:  Patient states "I called the ambulance cause I was having chest pain.  The I wanted detox.  I been drinking for 20 years; I drank everyday all day about 12 pack beer and fifth of liquor.  No I have never been in detox before.  I am just tired of the drinking and want to make a change."  Patient states that his last drink was yesterday.  Denies history of seizures and psychiatric history.  Patient also denies suicidal/homicidal ideation, psychosis, and paranoia.  HPI Elements:   Location:  Alcohol abuse. Quality:  day consumption of alcohol. Severity:  20 yrs of alcohol abuse. Timing:  20 yrs.  Past Psychiatric History: History reviewed. No pertinent past medical history.  reports that he has been smoking Cigarettes.  He has a 30 pack-year smoking history. He has never used smokeless tobacco. He reports that he drinks about 50.4 ounces of alcohol per week. He reports that he does not use illicit drugs. Family History  Problem Relation Age of Onset  . Heart failure Father    Family History Substance Abuse: No Family Supports: No Living Arrangements: Alone;Other (Comment) (Homeless) Can pt return to current living arrangement?: Yes Abuse/Neglect [facility]) Physical Abuse: Denies Verbal Abuse: Denies Sexual Abuse: Denies Allergies:  No Known Allergies  ACT Assessment Complete:  Yes:     Educational Status    Risk to Self: Risk to self Suicidal Ideation: No Suicidal Intent: No Is patient at risk for suicide?: No Suicidal Plan?: No What has been your use of drugs/alcohol within the last 12 months?:  (ETOH use reported daily) Previous Attempts/Gestures: No Recent stressful life event(s): Other (Comment) (lack of support system, homeless, unemployed) Persecutory voices/beliefs?: No Depression: Yes Depression Symptoms: Insomnia;Fatigue;Guilt;Loss of interest in usual pleasures;Feeling angry/irritable Substance abuse history and/or treatment for substance abuse?: Yes  Risk to Others: Risk to Others Homicidal Ideation: No Thoughts of Harm to Others: No Current Homicidal Intent: No Current Homicidal Plan: No Assessment of Violence: None Noted  Abuse: Abuse/Neglect Assessment (Assessment to be complete while patient is alone) Physical Abuse: Denies Verbal Abuse: Denies Sexual Abuse: Denies Exploitation of patient/patient's resources: Denies Self-Neglect: Denies  Prior Inpatient Therapy: Prior Inpatient Therapy Prior Inpatient Therapy: No  Prior Outpatient Therapy: Prior Outpatient Therapy Prior Outpatient Therapy: No  Additional Information:                    Objective: Blood pressure 133/88, pulse 110, temperature 98.3 F (36.8 C), temperature source Oral, resp. rate 16, SpO2 95.00%.There is no weight on file to calculate BMI. Results for orders placed during the hospital encounter of 04/03/14 (from the past 72 hour(s))  CBC WITH DIFFERENTIAL     Status: None   Collection Time    04/03/14  7:42 PM      Result Value Ref Range   WBC 5.7  4.0 - 10.5 K/uL   RBC 4.87  4.22 - 5.81 MIL/uL   Hemoglobin 15.9  13.0 - 17.0 g/dL   HCT 35.5  99.7 - 68.2 %   MCV 93.0  78.0 - 100.0 fL   MCH 32.6  26.0 - 34.0 pg   MCHC 35.1  30.0 - 36.0 g/dL   RDW 35.7  75.6 - 19.7 %   Platelets 155  150 - 400 K/uL   Neutrophils Relative % 43  43 - 77 %   Neutro Abs 2.5   1.7 - 7.7 K/uL   Lymphocytes Relative 43  12 - 46 %   Lymphs Abs 2.5  0.7 - 4.0 K/uL   Monocytes Relative 12  3 - 12 %   Monocytes Absolute 0.7  0.1 - 1.0 K/uL   Eosinophils Relative 1  0 - 5 %   Eosinophils Absolute 0.0  0.0 - 0.7 K/uL   Basophils Relative 1  0 - 1 %   Basophils Absolute 0.1  0.0 - 0.1 K/uL  COMPREHENSIVE METABOLIC PANEL     Status: Abnormal   Collection Time    04/03/14  7:42 PM      Result Value Ref Range   Sodium 138  137 - 147 mEq/L   Potassium 3.6 (*) 3.7 - 5.3 mEq/L   Chloride 98  96 - 112 mEq/L   CO2 22  19 - 32 mEq/L   Glucose, Bld 86  70 - 99 mg/dL   BUN 5 (*) 6 - 23 mg/dL   Creatinine, Ser 1.85  0.50 - 1.35 mg/dL   Calcium 8.9  8.4 - 69.2 mg/dL   Total Protein 7.9  6.0 - 8.3 g/dL   Albumin 3.9  3.5 - 5.2 g/dL   AST 699 (*) 0 - 37 U/L   ALT 107 (*) 0 - 53 U/L   Alkaline Phosphatase 111  39 - 117 U/L   Total Bilirubin 0.4  0.3 - 1.2 mg/dL   GFR calc non Af Amer >90  >90 mL/min   GFR calc Af Amer >90  >90 mL/min   Comment: (NOTE)     The eGFR has been calculated using the CKD EPI equation.     This calculation has not been validated in all clinical situations.     eGFR's persistently <90 mL/min signify possible Chronic Kidney     Disease.   Anion gap 18 (*) 5 - 15  ETHANOL     Status: Abnormal   Collection Time    04/03/14  7:42 PM      Result Value Ref Range   Alcohol, Ethyl (B) 293 (*) 0 - 11 mg/dL   Comment:            LOWEST DETECTABLE LIMIT FOR     SERUM ALCOHOL IS 11 mg/dL     FOR MEDICAL PURPOSES ONLY  URINE RAPID DRUG SCREEN (HOSP PERFORMED)     Status: Abnormal   Collection Time    04/03/14  8:02 PM      Result Value Ref Range   Opiates NONE DETECTED  NONE DETECTED   Cocaine POSITIVE (*) NONE DETECTED   Benzodiazepines NONE DETECTED  NONE DETECTED   Amphetamines NONE DETECTED  NONE DETECTED   Tetrahydrocannabinol NONE DETECTED  NONE DETECTED   Barbiturates NONE DETECTED  NONE DETECTED   Comment:            DRUG SCREEN FOR  MEDICAL PURPOSES     ONLY.  IF CONFIRMATION  IS NEEDED     FOR ANY PURPOSE, NOTIFY LAB     WITHIN 5 DAYS.                LOWEST DETECTABLE LIMITS     FOR URINE DRUG SCREEN     Drug Class       Cutoff (ng/mL)     Amphetamine      1000     Barbiturate      200     Benzodiazepine   109     Tricyclics       604     Opiates          300     Cocaine          300     THC              26  I-STAT TROPOININ, ED     Status: None   Collection Time    04/03/14  8:06 PM      Result Value Ref Range   Troponin i, poc 0.00  0.00 - 0.08 ng/mL   Comment 3            Comment: Due to the release kinetics of cTnI,     a negative result within the first hours     of the onset of symptoms does not rule out     myocardial infarction with certainty.     If myocardial infarction is still suspected,     repeat the test at appropriate intervals.  ETHANOL     Status: None   Collection Time    04/04/14 11:36 AM      Result Value Ref Range   Alcohol, Ethyl (B) <11  0 - 11 mg/dL   Comment:            LOWEST DETECTABLE LIMIT FOR     SERUM ALCOHOL IS 11 mg/dL     FOR MEDICAL PURPOSES ONLY   Labs are reviewed see above values (AST/ALT) chronic alcohol abuse.  Medications reviewed no changes made.  Ativan detox protocol  Current Facility-Administered Medications  Medication Dose Route Frequency Provider Last Rate Last Dose  . acetaminophen (TYLENOL) tablet 650 mg  650 mg Oral Q4H PRN Larene Pickett, PA-C      . alum & mag hydroxide-simeth (MAALOX/MYLANTA) 200-200-20 MG/5ML suspension 30 mL  30 mL Oral PRN Larene Pickett, PA-C      . ibuprofen (ADVIL,MOTRIN) tablet 600 mg  600 mg Oral Q8H PRN Larene Pickett, PA-C      . LORazepam (ATIVAN) tablet 0-4 mg  0-4 mg Oral 4 times per day Larene Pickett, PA-C   1 mg at 04/04/14 1147   Followed by  . [START ON 04/06/2014] LORazepam (ATIVAN) tablet 0-4 mg  0-4 mg Oral Q12H Larene Pickett, PA-C      . LORazepam (ATIVAN) tablet 1 mg  1 mg Oral Q8H PRN Larene Pickett, PA-C       . nicotine (NICODERM CQ - dosed in mg/24 hours) patch 21 mg  21 mg Transdermal Daily Larene Pickett, PA-C   21 mg at 04/04/14 0930  . ondansetron (ZOFRAN) tablet 4 mg  4 mg Oral Q8H PRN Larene Pickett, PA-C      . thiamine (VITAMIN B-1) tablet 100 mg  100 mg Oral Daily Larene Pickett, PA-C   100 mg at 04/04/14 0930   Or  . thiamine (B-1) injection 100 mg  100 mg Intravenous Daily Larene Pickett, PA-C      . zolpidem [facility]) tablet 5 mg  5 mg Oral QHS PRN Larene Pickett, PA-C       Current Outpatient Prescriptions  Medication Sig Dispense Refill  . Aspirin-Acetaminophen-Caffeine (GOODY HEADACHE PO) Take 1 each by mouth as needed (pain.).        Psychiatric Specialty Exam:     Blood pressure 133/88, pulse 110, temperature 98.3 F (36.8 C), temperature source Oral, resp. rate 16, SpO2 95.00%.There is no weight on file to calculate BMI.  General Appearance: Casual  Eye Contact::  Good  Speech:  Clear and Coherent and Normal Rate  Volume:  Normal  Mood:  Anxious  Affect:  Congruent  Thought Process:  Circumstantial and Goal Directed  Orientation:  Full (Time, Place, and Person)  Thought Content:  "I want to make a change; I'm tired"  Suicidal Thoughts:  No  Homicidal Thoughts:  No  Memory:  Immediate;   Good Recent;   Good Remote;   Good  Judgement:  Fair  Insight:  Fair  Psychomotor Activity:  Tremor  Concentration:  Fair  Recall:  Good  Fund of Knowledge:Good  Language: Good  Akathisia:  No  Handed:  Right  AIMS (if indicated):     Assets:  Communication Skills Desire for Improvement  Sleep:      Musculoskeletal: Strength & Muscle Tone: within normal limits Gait & Station: normal Patient leans: N/A  Treatment Plan Summary: Recommend inpatient detos. Patient was accepted to [facility] for inpatient detox.  Will monitor for safety and stabilization until transfer.  Earleen Newport, FNP-BC 04/04/2014 2:34 PM

## 2014-04-04 NOTE — [facility] Suicide Risk Assessment (Cosign Needed)
Suicide Risk Assessment  Discharge Assessment     Demographic Factors:  Male and Caucasian  Total Time spent with patient: 30 minutes Psychiatric Specialty Exam:      Blood pressure 133/88, pulse 110, temperature 98.3 F (36.8 C), temperature source Oral, resp. rate 16, SpO2 95.00%.There is no weight on file to calculate BMI.   General Appearance: Casual   Eye Contact:: Good   Speech: Clear and Coherent and Normal Rate   Volume: Normal   Mood: Anxious   Affect: Congruent   Thought Process: Circumstantial and Goal Directed   Orientation: Full (Time, Place, and Person)   Thought Content: "I want to make a change; I'm tired"   Suicidal Thoughts: No   Homicidal Thoughts: No   Memory: Immediate; Good  Recent; Good  Remote; Good   Judgement: Fair   Insight: Fair   Psychomotor Activity: Tremor   Concentration: Fair   Recall: Good   Fund of Knowledge:Good   Language: Good   Akathisia: No   Handed: Right   AIMS (if indicated):   Assets: Communication Skills  Desire for Improvement   Sleep:   Musculoskeletal:  Strength & Muscle Tone: within normal limits  Gait & Station: normal  Patient leans: N/A    Mental Status Per Nursing Assessment::   On Admission:     Current Mental Status by Physician: Patient denies suicidal/homicidal ideation, psychosis, and paranoia  Loss Factors: NA  Historical Factors: Family history of mental illness or substance abuse  Risk Reduction Factors:   Seeking help for alcohol abuse  Continued Clinical Symptoms:  Alcohol/Substance Abuse/Dependencies  Cognitive Features That Contribute To Risk:  None noted    Suicide Risk:  Minimal: No identifiable suicidal ideation.  Patients presenting with no risk factors but with morbid ruminations; may be classified as minimal risk based on the severity of the depressive symptoms  Discharge Diagnoses:  AXIS I: Alcohol Abuse  AXIS II: Deferred  AXIS III: History reviewed. No pertinent past  medical history.  AXIS IV: other psychosocial or environmental problems  AXIS V: 51-60 moderate symptoms  Plan Of Care/Follow-up recommendations:  Activity:  Resume usual activity Diet:  Resume usual diet Other:  Follow up with [facility] for inpatient treatment  Is patient on multiple antipsychotic therapies at discharge:  No   Has Patient had three or more failed trials of antipsychotic monotherapy by history:  No  Recommended Plan for Multiple Antipsychotic Therapies: NA    Caitlyn Buchanan, FNP-BC 04/04/2014, 2:50 PM
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
## 2014-04-04 NOTE — ED Notes (Signed)
Patient discharge via ambulatory with steady gait with [facility] staff. Patient denies SI, HI and AVH..Peter Howell

## 2014-04-04 NOTE — Progress Notes (Signed)
TTS Berna SpareMarcus notified of assessment consult needed.

## 2014-04-04 NOTE — ED Notes (Signed)
Pt. Sleeping. Pt. Alert and orientated when awakened, no acute distress. Pt. C/o headache, will medicate for same.

## 2014-04-04 NOTE — ED Notes (Signed)
Pt resting, pending [facility] at 9:30pm.

## 2014-04-04 NOTE — ED Notes (Signed)
Patient reported that she drank 6-8 ounces of chafing dish fuel. Dr. Silverio LayYao at bedside. Dr. Silverio LayYao also contacted poison control. No new orders received at this time. Patient alert and oriented x 4, skin warm and dry, respirations equal and unlabored. No acute distress noted. Will continue to monitor patient.

## 2014-04-04 NOTE — Progress Notes (Signed)
Pt was given [place] and [facility] for psycho-educational material. This Clinical research associatewriter explained the purpose of this information and pt was receptive of information given. This Clinical research associatewriter let pt know if he had any questions or wanted anymore information about any other resources or psycho-educational material.

## 2014-04-04 NOTE — ED Notes (Signed)
[facility]. Pt alert and oriented. -SI/HI, -A/[place]; verbally contracts for safety. Requests alcohol detox. Pt reports depression due to multiple stressors. Denies previous alcohol detox or psychiatric treatment. Will monitor closely and evaluate for stabilization.

## 2014-04-04 NOTE — ED Notes (Signed)
[facility] called to report they would be 20-25 minutes late.

## 2014-04-04 NOTE — Discharge Instructions (Signed)
Finding Treatment for Alcohol and Drug Addiction It can be hard to find the right place to get professional treatment. Here are some important things to consider:  There are different types of treatment to choose from.  Some programs are live-in (residential) while others are not (outpatient). Sometimes a combination is offered.  No single type of program is right for everyone.  Most treatment programs involve a combination of education, counseling, and a 12-step, spiritually-based approach.  There are non-spiritually based programs (not 12-step).  Some treatment programs are government sponsored. They are geared for patients without private insurance.  Treatment programs can vary in many respects such as:  Cost and types of insurance accepted.  Types of on-site medical services offered.  Length of stay, setting, and size.  Overall philosophy of treatment. A person may need specialized treatment or have needs not addressed by all programs. For example, adolescents need treatment appropriate for their age. Other people have secondary disorders that must be managed as well. Secondary conditions can include mental illness, such as depression or diabetes. Often, a period of detoxification from alcohol or drugs is needed. This requires medical supervision and not all programs offer this. THINGS TO CONSIDER WHEN SELECTING A TREATMENT PROGRAM   Is the program certified by the appropriate government agency? Even private programs must be certified and employ certified professionals.  Does the program accept your insurance? If not, can a payment plan be set up?  Is the facility clean, organized, and well run? Do they allow you to speak with graduates who can share their treatment experience with you? Can you tour the facility? Can you meet with staff?  Does the program meet the full range of individual needs?  Does the treatment program address sexual orientation and physical disabilities?  Do they provide age, gender, and culturally appropriate treatment services?  Is treatment available in languages other than English?  Is long-term aftercare support or guidance encouraged and provided?  Is assessment of an individual's treatment plan ongoing to ensure it meets changing needs?  Does the program use strategies to encourage reluctant patients to remain in treatment long enough to increase the likelihood of success?  Does the program offer counseling (individual or group) and other behavioral therapies?  Does the program offer medicine as part of the treatment regimen, if needed?  Is there ongoing monitoring of possible relapse? Is there a defined relapse prevention program? Are services or referrals offered to family members to ensure they understand addiction and the recovery process? This would help them support the recovering individual.  Are 12-step meetings held at the center or is transport available for patients to attend outside meetings? In countries outside of the [place], Magazine features editorsee local directories for contact information for services in your area. Document Released: 08/14/2005 Document Revised: 12/08/2011 Document Reviewed: 02/24/2008 [facility] Patient Information 2015 [place], [place]. This information is not intended to replace advice given to you by your health care provider. Make sure you discuss any questions you have with your health care provider.  Alcohol Problems Most adults who drink alcohol drink in moderation (not a lot) are at low risk for developing problems related to their drinking. However, all drinkers, including low-risk drinkers, should know about the health risks connected with drinking alcohol. RECOMMENDATIONS FOR LOW-RISK DRINKING  Drink in moderation. Moderate drinking is defined as follows:   Men - no more than 2 drinks per day.  Nonpregnant women - no more than 1 drink per day.  Over age  65 - no more than 1 drink per day. A standard  drink is 12 grams of pure alcohol, which is equal to a 12 ounce bottle of beer or wine cooler, a 5 ounce glass of wine, or 1.5 ounces of distilled spirits (such as whiskey, brandy, vodka, or rum).  ABSTAIN FROM (DO NOT DRINK) ALCOHOL:  When pregnant or considering pregnancy.  When taking a medication that interacts with alcohol.  If you are alcohol dependent.  A medical condition that prohibits drinking alcohol (such as ulcer, liver disease, or heart disease). DISCUSS WITH YOUR CAREGIVER:  If you are at risk for coronary heart disease, discuss the potential benefits and risks of alcohol use: Light to moderate drinking is associated with lower rates of coronary heart disease in certain populations (for example, men over age 53 and postmenopausal women). Infrequent or nondrinkers are advised not to begin light to moderate drinking to reduce the risk of coronary heart disease so as to avoid creating an alcohol-related problem. Similar protective effects can likely be gained through proper diet and exercise.  Women and the elderly have smaller amounts of body water than men. As a result women and the elderly achieve a higher blood alcohol concentration after drinking the same amount of alcohol.  Exposing a fetus to alcohol can cause a broad range of birth defects referred to as Fetal Alcohol Syndrome (FAS) or Alcohol-Related Birth Defects (ARBD). Although FAS/ARBD is connected with excessive alcohol consumption during pregnancy, studies also have reported neurobehavioral problems in infants born to mothers reporting drinking an average of 1 drink per day during pregnancy.  Heavier drinking (the consumption of more than 4 drinks per occasion by men and more than 3 drinks per occasion by women) impairs learning (cognitive) and psychomotor functions and increases the risk of alcohol-related problems, including accidents and injuries. CAGE QUESTIONS:   Have you ever felt that you should Cut down on your  drinking?  Have people Annoyed you by criticizing your drinking?  Have you ever felt bad or Guilty about your drinking?  Have you ever had a drink first thing in the morning to steady your nerves or get rid of a hangover (Eye opener)? If you answered positively to any of these questions: You may be at risk for alcohol-related problems if alcohol consumption is:   Men: Greater than 14 drinks per week or more than 4 drinks per occasion.  Women: Greater than 7 drinks per week or more than 3 drinks per occasion. Do you or your family have a medical history of alcohol-related problems, such as:  Blackouts.  Sexual dysfunction.  Depression.  Trauma.  Liver dysfunction.  Sleep disorders.  Hypertension.  Chronic abdominal pain.  Has your drinking ever caused you problems, such as problems with your family, problems with your work (or school) performance, or accidents/injuries?  Do you have a compulsion to drink or a preoccupation with drinking?  Do you have poor control or are you unable to stop drinking once you have started?  Do you have to drink to avoid withdrawal symptoms?  Do you have problems with withdrawal such as tremors, nausea, sweats, or mood disturbances?  Does it take more alcohol than in the past to get you high?  Do you feel a strong urge to drink?  Do you change your plans so that you can have a drink?  Do you ever drink in the morning to relieve the shakes or a hangover? If you have answered a number of the previous  questions positively, it may be time for you to talk to your caregivers, family, and friends and see if they think you have a problem. Alcoholism is a chemical dependency that keeps getting worse and will eventually destroy your health and relationships. Many alcoholics end up dead, impoverished, or in prison. This is often the end result of all chemical dependency.  Do not be discouraged if you are not ready to take action  immediately.  Decisions to change behavior often involve up and down desires to change and feeling like you cannot decide.  Try to think more seriously about your drinking behavior.  Think of the reasons to quit. WHERE TO GO FOR ADDITIONAL INFORMATION   The National Institute on Alcohol Abuse and Alcoholism (NIAAA) BasicStudents.dkwww.niaaa.nih.gov  [employer] Council on Alcoholism and Drug Dependence (NCADD) www.ncadd.org  American Society of Addiction Medicine (ASAM) RoyalDiary.glwww.asam.org  Document Released: 09/15/2005 Document Revised: 12/08/2011 Document Reviewed: 05/03/2008 [facility] Patient Information 2015 [place], [place]. This information is not intended to replace advice given to you by your health care provider. Make sure you discuss any questions you have with your health care provider.

## 2014-04-05 NOTE — Consult Note (Signed)
Face to face evaluation and I agree with this note 

## 2014-04-05 NOTE — ED Provider Notes (Signed)
Medical screening examination/treatment/procedure(s) were performed by non-physician practitioner and as supervising physician I was immediately available for consultation/collaboration.   EKG Interpretation   Date/Time:  Monday April 03 2014 19:49:19 EDT Ventricular Rate:  81 PR Interval:  141 QRS Duration: 91 QT Interval:  393 QTC Calculation: 456 R Axis:   43 Text Interpretation:  Sinus rhythm Low voltage, extremity leads Confirmed  by Blinda LeatherwoodPOLLINA  MD, CHRISTOPHER [phone] on 04/03/2014 7:59:30 PM        Gilda Creasehristopher J. Pollina, MD 04/05/14 [phone]

## 2014-07-14 ENCOUNTER — Other Ambulatory Visit: Payer: Self-pay

## 2015-01-11 ENCOUNTER — Emergency Department (HOSPITAL_COMMUNITY): Payer: Self-pay

## 2015-01-11 ENCOUNTER — Observation Stay (HOSPITAL_COMMUNITY): Payer: Self-pay

## 2015-01-11 ENCOUNTER — Encounter (HOSPITAL_COMMUNITY): Payer: Self-pay | Admitting: *Deleted

## 2015-01-11 ENCOUNTER — Inpatient Hospital Stay (HOSPITAL_COMMUNITY)
Admission: EM | Admit: 2015-01-11 | Discharge: 2015-01-13 | DRG: 312 | Discharge status: Against Medical Advice | Payer: Self-pay | Attending: Internal Medicine | Admitting: Internal Medicine

## 2015-01-11 DIAGNOSIS — I951 Orthostatic hypotension: Secondary | ICD-10-CM | POA: Diagnosis present

## 2015-01-11 DIAGNOSIS — R296 Repeated falls: Secondary | ICD-10-CM

## 2015-01-11 DIAGNOSIS — R55 Syncope and collapse: Principal | ICD-10-CM

## 2015-01-11 DIAGNOSIS — F1010 Alcohol abuse, uncomplicated: Secondary | ICD-10-CM

## 2015-01-11 DIAGNOSIS — Y908 Blood alcohol level of 240 mg/100 ml or more: Secondary | ICD-10-CM | POA: Diagnosis present

## 2015-01-11 DIAGNOSIS — F10129 Alcohol abuse with intoxication, unspecified: Secondary | ICD-10-CM | POA: Diagnosis present

## 2015-01-11 DIAGNOSIS — W19XXXA Unspecified fall, initial encounter: Secondary | ICD-10-CM

## 2015-01-11 DIAGNOSIS — F17210 Nicotine dependence, cigarettes, uncomplicated: Secondary | ICD-10-CM | POA: Diagnosis present

## 2015-01-11 DIAGNOSIS — I10 Essential (primary) hypertension: Secondary | ICD-10-CM | POA: Diagnosis present

## 2015-01-11 DIAGNOSIS — E871 Hypo-osmolality and hyponatremia: Secondary | ICD-10-CM

## 2015-01-11 DIAGNOSIS — Z9181 History of falling: Secondary | ICD-10-CM

## 2015-01-11 HISTORY — DX: Essential (primary) hypertension: I10

## 2015-01-11 LAB — COMPREHENSIVE METABOLIC PANEL
ALBUMIN: 4.3 g/dL (ref 3.5–5.2)
ALK PHOS: 76 U/L (ref 39–117)
ALT: 73 U/L — AB (ref 0–53)
AST: 66 U/L — AB (ref 0–37)
Anion gap: 14 (ref 5–15)
BUN: 12 mg/dL (ref 6–23)
CALCIUM: 9.7 mg/dL (ref 8.4–10.5)
CO2: 23 mmol/L (ref 19–32)
Chloride: 91 mmol/L — ABNORMAL LOW (ref 96–112)
Creatinine, Ser: 0.65 mg/dL (ref 0.50–1.35)
GFR calc Af Amer: >90 mL/min (ref >90)
GFR calc non Af Amer: >90 mL/min (ref >90)
Glucose, Bld: 124 mg/dL — ABNORMAL HIGH (ref 70–99)
POTASSIUM: 3.5 mmol/L (ref 3.5–5.1)
Sodium: 128 mmol/L — ABNORMAL LOW (ref 135–145)
TOTAL PROTEIN: 8.1 g/dL (ref 6.0–8.3)
Total Bilirubin: 0.6 mg/dL (ref 0.3–1.2)

## 2015-01-11 LAB — ETHANOL: ALCOHOL ETHYL (B): 254 mg/dL — AB (ref 0–9)

## 2015-01-11 LAB — TROPONIN I: Troponin I: <0.03 ng/mL (ref <0.031)

## 2015-01-11 LAB — RAPID URINE DRUG SCREEN, HOSP PERFORMED
Amphetamines: NOT DETECTED
Barbiturates: NOT DETECTED
Benzodiazepines: NOT DETECTED
COCAINE: NOT DETECTED
OPIATES: NOT DETECTED
Tetrahydrocannabinol: NOT DETECTED

## 2015-01-11 LAB — CBC
HCT: 40.9 % (ref 39.0–52.0)
Hemoglobin: 14.2 g/dL (ref 13.0–17.0)
MCH: 33 pg (ref 26.0–34.0)
MCHC: 34.7 g/dL (ref 30.0–36.0)
MCV: 95.1 fL (ref 78.0–100.0)
PLATELETS: 372 10*3/uL (ref 150–400)
RBC: 4.3 MIL/uL (ref 4.22–5.81)
RDW: 13.1 % (ref 11.5–15.5)
WBC: 10.5 10*3/uL (ref 4.0–10.5)

## 2015-01-11 LAB — PROTIME-INR
INR: 0.92 (ref 0.00–1.49)
Prothrombin Time: 12.5 seconds (ref 11.6–15.2)

## 2015-01-11 LAB — I-STAT TROPONIN, ED: Troponin i, poc: 0 ng/mL (ref 0.00–0.08)

## 2015-01-11 LAB — TSH: TSH: 1.202 u[IU]/mL (ref 0.350–4.500)

## 2015-01-11 MED ORDER — SODIUM CHLORIDE 0.9 % IJ SOLN
3.0000 mL | Freq: Two times a day (BID) | INTRAMUSCULAR | Status: DC
  Administered 2015-01-12: 3 mL via INTRAVENOUS

## 2015-01-11 MED ORDER — HEPARIN SODIUM (PORCINE) 5000 UNIT/ML IJ SOLN
5000.0000 [IU] | Freq: Three times a day (TID) | INTRAMUSCULAR | Status: DC
  Administered 2015-01-11 – 2015-01-12 (×2): 5000 [IU] via SUBCUTANEOUS
  Filled 2015-01-11 (×2): qty 1

## 2015-01-11 MED ORDER — ACETAMINOPHEN 650 MG RE SUPP: 650.0000 mg | Freq: Four times a day (QID) | RECTAL | Status: DC | PRN

## 2015-01-11 MED ORDER — ACETAMINOPHEN 325 MG PO TABS
650.0000 mg | ORAL_TABLET | Freq: Four times a day (QID) | ORAL | Status: DC | PRN
  Administered 2015-01-11: 650 mg via ORAL
  Filled 2015-01-11: qty 2

## 2015-01-11 MED ORDER — ONDANSETRON HCL 4 MG/2ML IJ SOLN: 4.0000 mg | Freq: Four times a day (QID) | INTRAMUSCULAR | Status: DC | PRN

## 2015-01-11 MED ORDER — THIAMINE HCL 100 MG/ML IJ SOLN
Freq: Once | INTRAVENOUS | Status: AC
  Administered 2015-01-11: 18:00:00 via INTRAVENOUS
  Filled 2015-01-11: qty 1000

## 2015-01-11 MED ORDER — ONDANSETRON HCL 4 MG PO TABS: 4.0000 mg | ORAL_TABLET | Freq: Four times a day (QID) | ORAL | Status: DC | PRN

## 2015-01-11 MED ORDER — NICOTINE 21 MG/24HR TD PT24
21.0000 mg | MEDICATED_PATCH | Freq: Once | TRANSDERMAL | Status: DC
  Administered 2015-01-11: 21 mg via TRANSDERMAL
  Filled 2015-01-11: qty 1

## 2015-01-11 MED ORDER — HYDROCODONE-ACETAMINOPHEN 5-325 MG PO TABS
1.0000 | ORAL_TABLET | ORAL | Status: DC | PRN
  Administered 2015-01-11 (×2): 2 via ORAL
  Administered 2015-01-12: 1 via ORAL
  Administered 2015-01-12 (×3): 2 via ORAL
  Administered 2015-01-12: 1 via ORAL
  Administered 2015-01-13 (×3): 2 via ORAL
  Filled 2015-01-11 (×10): qty 2

## 2015-01-11 MED ORDER — MORPHINE SULFATE 2 MG/ML IJ SOLN: 1.0000 mg | INTRAMUSCULAR | Status: DC | PRN

## 2015-01-11 MED ORDER — SODIUM CHLORIDE 0.9 % IV SOLN
INTRAVENOUS | Status: DC
  Administered 2015-01-12 – 2015-01-13 (×3): via INTRAVENOUS

## 2015-01-11 NOTE — ED Notes (Signed)
Bed: [facility] Expected date:  Expected time:  Means of arrival:  Comments: EMS-40 oz 

## 2015-01-11 NOTE — Progress Notes (Signed)
Received a call from MRI that patient passed out while on the process of doing MRI. Patient was alert and responsive by the time  RN reach MRI, unable to recall what happened. Neuro check done, PERRLA, smile symmetrical, tongue midline, speech per baseline,(slight slurring).Complained of headache  But no nausea .Slight tremors noted  On fingertips when arms exterded.   01/11/15 2113  Vitals  Temp 98.1 F (36.7 C)  Temp Source Oral  BP 111/65 mmHg  BP Location Left Arm  BP Method Automatic  Patient Position (if appropriate) Lying  Pulse Rate 84  Resp 18  Oxygen Therapy  SpO2 92 %  O2 Device Room Air   On call NP Lenny Pastelom Callahan paged and made aware about incident. Ordered to postpone MRI in am. Will monitor patient closely.
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
## 2015-01-11 NOTE — ED Notes (Addendum)
Patient presents to the ED from home with complaints of headache for 2 weeks.  Patient states Goody powder makes head pain better and makes nothing worse.  Patient rates pain 8/10.  Patient states he has had cough productive of white sputum x2 weeks.  Patient states he has also had multiple falls over past several weeks.  Patient reports he has already had a pint of Vodka today and drinks ETOH daily.  On exam, patients lung sounds are clear.  Heart sounds WNL, S1/S2, no gallop or rub.  +3 radial pulses.  No pre-tibial and pedal edema.  CNIII intact, EOMI.  Skin warm and dry.  Patient has a laceration to right posterior scalp and abrasion to left posterior scalp.  Bleeding is controlled and they are in healing stage.
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
## 2015-01-11 NOTE — ED Notes (Signed)
Patient appeared to try and leave department and was re-directed to room.
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
## 2015-01-11 NOTE — ED Notes (Signed)
Linard Millersindy Galvin, pt's sister, called to give history on pt. Pt's sister reports the pt has had frequent falls and head injuries over the past several months. Pt fell last Friday, hitting the back of his head; pt refused care from EMS. Pt also passed out on Tuesday and Wednesday this week. Pt's sister is concerned pt has brain bleed.   Linard MillersCindy Galvin phone #:  [phone]

## 2015-01-11 NOTE — Progress Notes (Addendum)
ED CM noted Cm consult for possible medication needs.  Pt not in his room at this time.  CM aware pt left his room earlier without nursing staff permission  1615 Pt seen with his RN coming from ED CT and heading to [facility] 1424 CM provided pt with self pay resources Placed them in his personal belonging bag Pt acknowledged reception of resources CM spoke with pt who [place]. CM discussed and provided written information for self pay pcps, importance of pcp for f/u care, www.needymeds.org, www.goodrx.com, discounted pharmacies and other [place] resources such as [facility], [employer], affordable care act,  financial assistance, DSS and  health department  Reviewed resources for [place] self pay pcps like [facility], [facility] [address], [facility], general medical clinics, [facility] plus others, medication resources, [facility] out patient [facility]ies and housing Pt voiced understanding and appreciation of resources provided

## 2015-01-11 NOTE — ED Notes (Signed)
Per EMS - patient comes from home where he lives with his sister.  Patient presents today with c/o headache x2 weeks.  Patient states he drinks ETOH every day.  Thus far today, patient has had a pint Vodka.  Patient states he has had several falls over past few weeks.  Patient does not associate his drinking with his falls.  Patient denies N/V.  Vitals on scene 130/70, HR 70, RR 18, 137 CBG.

## 2015-01-12 ENCOUNTER — Inpatient Hospital Stay (HOSPITAL_COMMUNITY): Payer: No Typology Code available for payment source

## 2015-01-12 DIAGNOSIS — F10129 Alcohol abuse with intoxication, unspecified: Secondary | ICD-10-CM

## 2015-01-12 LAB — CBC
HEMATOCRIT: 38.2 % — AB (ref 39.0–52.0)
HEMOGLOBIN: 13.1 g/dL (ref 13.0–17.0)
MCH: 32.5 pg (ref 26.0–34.0)
MCHC: 34.3 g/dL (ref 30.0–36.0)
MCV: 94.8 fL (ref 78.0–100.0)
Platelets: 369 10*3/uL (ref 150–400)
RBC: 4.03 MIL/uL — ABNORMAL LOW (ref 4.22–5.81)
RDW: 13 % (ref 11.5–15.5)
WBC: 7.4 10*3/uL (ref 4.0–10.5)

## 2015-01-12 LAB — BASIC METABOLIC PANEL
ANION GAP: 8 (ref 5–15)
BUN: 13 mg/dL (ref 6–23)
CALCIUM: 8.6 mg/dL (ref 8.4–10.5)
CHLORIDE: 94 mmol/L — AB (ref 96–112)
CO2: 25 mmol/L (ref 19–32)
Creatinine, Ser: 0.57 mg/dL (ref 0.50–1.35)
GFR calc non Af Amer: >90 mL/min (ref >90)
Glucose, Bld: 88 mg/dL (ref 70–99)
Potassium: 3.5 mmol/L (ref 3.5–5.1)
Sodium: 127 mmol/L — ABNORMAL LOW (ref 135–145)

## 2015-01-12 LAB — TROPONIN I

## 2015-01-12 MED ORDER — ADULT MULTIVITAMIN W/MINERALS CH
1.0000 | ORAL_TABLET | Freq: Every day | ORAL | Status: DC
  Administered 2015-01-12 – 2015-01-13 (×2): 1 via ORAL
  Filled 2015-01-12 (×2): qty 1

## 2015-01-12 MED ORDER — VITAMIN B-1 100 MG PO TABS
100.0000 mg | ORAL_TABLET | Freq: Every day | ORAL | Status: DC
  Administered 2015-01-12 – 2015-01-13 (×2): 100 mg via ORAL
  Filled 2015-01-12 (×2): qty 1

## 2015-01-12 MED ORDER — LORAZEPAM 1 MG PO TABS
1.0000 mg | ORAL_TABLET | Freq: Four times a day (QID) | ORAL | Status: DC | PRN
  Administered 2015-01-12 – 2015-01-13 (×4): 1 mg via ORAL
  Filled 2015-01-12 (×4): qty 1

## 2015-01-12 MED ORDER — FOLIC ACID 1 MG PO TABS
1.0000 mg | ORAL_TABLET | Freq: Every day | ORAL | Status: DC
Start: 2015-01-12 — End: 2015-01-13
  Administered 2015-01-12 – 2015-01-13 (×2): 1 mg via ORAL
  Filled 2015-01-12 (×2): qty 1

## 2015-01-12 MED ORDER — THIAMINE HCL 100 MG/ML IJ SOLN: 100.0000 mg | Freq: Every day | INTRAMUSCULAR | Status: DC

## 2015-01-12 MED ORDER — LORAZEPAM 2 MG/ML IJ SOLN
1.0000 mg | Freq: Four times a day (QID) | INTRAMUSCULAR | Status: DC | PRN
  Administered 2015-01-12: 1 mg via INTRAVENOUS
  Filled 2015-01-12: qty 1

## 2015-01-12 NOTE — Progress Notes (Signed)
Patient ID: Peter Howell, male   DOB: Dec 27, 1960, 54 y.o.   MRN: [id number] [facility] PROGRESS NOTE  Peter Howell JJO:[id number] DOB: Dec 27, 1960 DOA: 01/11/2015 PCP: No PCP Per Patient  Brief narrative:    54 -year-old male with past medical history significant for hypertension, alcohol abuse who [facility] post multiple falls, syncopal events. Patient was found to have alcohol level of 254. Urine drug screen negative. He was admitted for further management of acute alcohol intoxication. CT head on admission showed hematoma in parietal and occipital area but no acute intracranial findings.   Assessment/Plan:    Principal Problem: Syncope / acute alcohol intoxication - Syncope likely secondary to acute alcohol intoxication. Alcohol level 254 on the admission. - 2-D echo is not required for evaluation of syncope because syncope is most likely because of alcohol intoxication. Urine drug screen was within normal limits. Troponin levels within normal limits. - CT head and cervical spine showed hematoma in the parietal and occipital area but no acute intracranial findings. - Placed on CIWA protocol - Monitor for withdrawals, seizures. - Continue IV fluids. - Physical therapy evaluation once patient able to participate - Continue multivitamin, thiamine and folic acid.  Active Problems: Hyponatremia - Likely secondary to alcohol abuse, intoxication - Sodium level 127. Continue IV fluids. - Follow-up BMP in the morning    DVT Prophylaxis  - Heparin subcutaneous ordered but will change to SCDs for DVT prophylaxis because of hematoma seen on CT scan.   Code Status: Full.  Family Communication:  plan of care discussed with the patient; family not at the bedside Disposition Plan: has hyponatremia, monitor for withdrawals   IV access:  Peripheral IV  Procedures and diagnostic studies:    Dg Chest 2 View 01/11/2015  No active cardiopulmonary  disease.   Electronically Signed   By: Amie Portlandavid  Ormond M.D.   On: 01/11/2015 13:20   Ct Head Wo Contrast 01/11/2015  Small right parieto-occipital scalp hematoma. No fracture. Mild small vessel disease in the periventricular white matter, stable. No intracranial mass, hemorrhage, or extra-axial fluid collection. No midline shift.   Electronically Signed   By: Bretta BangWilliam  Woodruff III M.D.   On: 01/11/2015 16:21   Ct Head Wo Contrast 01/11/2015  CT head: Mild patchy periventricular small vessel disease. No intracranial mass, hemorrhage, or acute appearing infarct. No extra-axial fluid collections are identified.  CT cervical spine: Multilevel osteoarthritic change, essentially stable left paracentral disc protrusions at C5-6 and C6-7 on the left for stable. No fracture or spondylolisthesis. There is carotid artery calcification bilaterally.   Electronically Signed   By: Bretta BangWilliam  Woodruff III M.D.   On: 01/11/2015 13:34   Ct Cervical Spine Wo Contrast 01/11/2015  CT head: Mild patchy periventricular small vessel disease. No intracranial mass, hemorrhage, or acute appearing infarct. No extra-axial fluid collections are identified.  CT cervical spine: Multilevel osteoarthritic change, essentially stable left paracentral disc protrusions at C5-6 and C6-7 on the left for stable. No fracture or spondylolisthesis. There is carotid artery calcification bilaterally.   Electronically Signed   By: Bretta BangWilliam  Woodruff III M.D.   On: 01/11/2015 13:34    Medical Consultants:  None  Other Consultants:  Physical therapy  IAnti-Infectives:   None    Manson PasseyEVINE, Eric Morganti, MD  [facility] Pager [phone]  Time spent in minutes: 25 minutes  If 7PM-7AM, please contact night-coverage [email or web address] Password [email or web address] 01/12/2015, 1:05 PM      HPI/Subjective: No acute overnight events. Disoriented  this am.   Objective: Filed Vitals:   01/11/15 1959 01/11/15 2113 01/12/15 0543 01/12/15 0547  BP: 96/71 111/65 158/86 170/97   Pulse: 81 84 86 89  Temp: 98.1 F (36.7 C) 98.1 F (36.7 C) 97.4 F (36.3 C)   TempSrc: Oral Oral Oral   Resp: Height:      Weight:      SpO2: 93% 92% 93%     Intake/Output Summary (Last 24 hours) at 01/12/15 1305 Last data filed at 01/12/15 0900  Gross per 24 hour  Intake 611.67 ml  Output    900 ml  Net -288.33 ml    Exam:   General:  Pt is alert, not in acute distress, disoriented   Cardiovascular: Regular rate and rhythm, S1/S2, no murmurs  Respiratory: Clear to auscultation bilaterally, no wheezing, no crackles, no rhonchi  Abdomen: Soft, non tender, non distended, bowel sounds present  Extremities: pulses DP and PT palpable bilaterally, no swelling   Neuro: Grossly nonfocal  Data Reviewed: Basic Metabolic Panel:  Recent Labs Lab 01/11/15 1208 01/12/15 0508  NA 128* 127*  K 3.5 3.5  CL 91* 94*  CO2 23 25  GLUCOSE 124* 88  BUN 12 13  CREATININE 0.65 0.57  CALCIUM 9.7 8.6   Liver Function Tests:  Recent Labs Lab 01/11/15 1208  AST 66*  ALT 73*  ALKPHOS 76  BILITOT 0.6  PROT 8.1  ALBUMIN 4.3   No results for input(s): LIPASE, AMYLASE in the last 168 hours. No results for input(s): AMMONIA in the last 168 hours. CBC:  Recent Labs Lab 01/11/15 1208 01/12/15 0508  WBC 10.5 7.4  HGB 14.2 13.1  HCT 40.9 38.2*  MCV 95.1 94.8  PLT 372 369   Cardiac Enzymes:  Recent Labs Lab 01/11/15 1741 01/11/15 2245 01/12/15 0508  TROPONINI <0.03 <0.03 <0.03   BNP: Invalid input(s): POCBNP CBG: No results for input(s): GLUCAP in the last 168 hours.  No results found for this or any previous visit (from the past 240 hour(s)).   Scheduled Meds: . folic acid  1 mg Oral Daily  . heparin  5,000 Units Subcutaneous 3 times per day  . multivitamin with minerals  1 tablet Oral Daily  . sodium chloride  3 mL Intravenous Q12H  . thiamine  100 mg Oral Daily   Or  . thiamine  100 mg Intravenous Daily   Continuous Infusions: . sodium  chloride 100 mL/hr at 01/12/15 0329

## 2015-01-12 NOTE — Progress Notes (Signed)
UR completed 

## 2015-01-13 DIAGNOSIS — F10120 Alcohol abuse with intoxication, uncomplicated: Secondary | ICD-10-CM

## 2015-01-13 LAB — BASIC METABOLIC PANEL
Anion gap: 5 (ref 5–15)
BUN: 8 mg/dL (ref 6–23)
CALCIUM: 8.3 mg/dL — AB (ref 8.4–10.5)
CO2: 30 mmol/L (ref 19–32)
CREATININE: 0.62 mg/dL (ref 0.50–1.35)
Chloride: 94 mmol/L — ABNORMAL LOW (ref 96–112)
Glucose, Bld: 101 mg/dL — ABNORMAL HIGH (ref 70–99)
POTASSIUM: 4 mmol/L (ref 3.5–5.1)
Sodium: 129 mmol/L — ABNORMAL LOW (ref 135–145)

## 2015-01-13 LAB — CBC
HCT: 38 % — ABNORMAL LOW (ref 39.0–52.0)
Hemoglobin: 12.9 g/dL — ABNORMAL LOW (ref 13.0–17.0)
MCH: 32.3 pg (ref 26.0–34.0)
MCHC: 33.9 g/dL (ref 30.0–36.0)
MCV: 95 fL (ref 78.0–100.0)
PLATELETS: 329 10*3/uL (ref 150–400)
RBC: 4 MIL/uL — ABNORMAL LOW (ref 4.22–5.81)
RDW: 13.1 % (ref 11.5–15.5)
WBC: 8.2 10*3/uL (ref 4.0–10.5)

## 2015-01-13 LAB — HEMOGLOBIN A1C
HEMOGLOBIN A1C: 5.9 % — AB (ref 4.8–5.6)
Mean Plasma Glucose: 123 mg/dL

## 2015-01-13 NOTE — Progress Notes (Signed)
Helped pt to the bathroom around 1815. Saw bottle of vodka in patients bookbag, instructed pt we could not let him keep that at his bedside. At first he was okay with this. I conducted his CIWA and was headed to get his ativan and pain medicine when he decided he was going to leave. I asked how he was going to get home and he said he'd catch the bus. I instructed him about his sodium level being low and needing to repeat his labs in the morning but he was insistent that he was going to leave. I informed the charge nurse and had patient sign the AMA form. Pt dressed himself, IV was removed and patient left the floor. MD was paged to inform them of pt's leaving.   Torsten Weniger, Ok EdwardsKellie Marie RN

## 2015-01-13 NOTE — Progress Notes (Addendum)
Patient ID: Peter Howell, male   DOB: [birth year], 54 y.o.   MRN: [id number] [facility] PROGRESS NOTE  Peter Howell [id number]:[id number] DOB: January 23, 1961 DOA: 01/11/2015 PCP: No PCP Per Patient  Brief narrative:    54 -year-old male with past medical history significant for hypertension, alcohol abuse who presented to [facility] long hospital status post multiple falls, syncopal events. Patient was found to have alcohol level of 254. Urine drug screen negative.  CT head on admission showed hematoma in parietal and occipital area but no acute intracranial findings.Blood work notable for hyponatremia.   Assessment/Plan:    Principal Problem: Syncope / acute alcohol intoxication - Syncope likely secondary to acute alcohol intoxication considering alcohol level of 254 on the admission. - Urine drug screen was within normal limits. Troponin levels within normal limits. - CT head and cervical spine showed hematoma in the parietal and occipital area; no acute intracranial findings. - No reports of withdrawals. On CIWA protocol.  - Continue multivitamin, thiamine and folic acid.  Active Problems: Hyponatremia - Likely secondary to alcohol abuse - Sodium level 127 on admission, 129 this morning.  - Will follow up BMP in am - Sodium improved with IV fluids.   DVT Prophylaxis  - SCD's bilaterally    Code Status: Full.  Family Communication:  plan of care discussed with the patient; family not at the bedside Disposition Plan: still hyponatremia, 129 this am, needs BMP in am, if sodium improved then home in next 24 hours.   IV access:  Peripheral IV  Procedures and diagnostic studies:    Dg Chest 2 View 01/11/2015  No active cardiopulmonary disease.   Electronically Signed   By: Amie Portland M.D.   On: 01/11/2015 13:20   Ct Head Wo Contrast 01/11/2015  Small right parieto-occipital scalp hematoma. No fracture. Mild small vessel disease in the periventricular white matter, stable. No  intracranial mass, hemorrhage, or extra-axial fluid collection. No midline shift.   Electronically Signed   By: Bretta Bang III M.D.   On: 01/11/2015 16:21   Ct Head Wo Contrast 01/11/2015  CT head: Mild patchy periventricular small vessel disease. No intracranial mass, hemorrhage, or acute appearing infarct. No extra-axial fluid collections are identified.  CT cervical spine: Multilevel osteoarthritic change, essentially stable left paracentral disc protrusions at C5-6 and C6-7 on the left for stable. No fracture or spondylolisthesis. There is carotid artery calcification bilaterally.   Electronically Signed   By: Bretta Bang III M.D.   On: 01/11/2015 13:34   Ct Cervical Spine Wo Contrast 01/11/2015  CT head: Mild patchy periventricular small vessel disease. No intracranial mass, hemorrhage, or acute appearing infarct. No extra-axial fluid collections are identified.  CT cervical spine: Multilevel osteoarthritic change, essentially stable left paracentral disc protrusions at C5-6 and C6-7 on the left for stable. No fracture or spondylolisthesis. There is carotid artery calcification bilaterally.   Electronically Signed   By: Bretta Bang III M.D.   On: 01/11/2015 13:34    Medical Consultants:  None  Other Consultants:  Physical therapy  IAnti-Infectives:   None    Manson Passey, MD  [facility] Pager [phone]  Time spent in minutes: 15 minutes  If 7PM-7AM, please contact night-coverage [email or web address] Password [facility] 01/13/2015, 7:42 PM   LOS: 1 day    HPI/Subjective: No acute overnight events. More alert. Asks questions. No reports of shaking.   Objective: Filed Vitals:   01/12/15 1444 01/12/15 2048 01/13/15 0549 01/13/15 1624  BP: 157/93 148/89 142/92  162/94  Pulse: 94 83 87 86  Temp: 98.6 F (37 C) 98 F (36.7 C) 98.3 F (36.8 C) 97.8 F (36.6 C)  TempSrc: Oral Oral Oral Oral  Resp: 20 20 22 18   Height:      Weight:      SpO2: 95% 99% 95% 95%     Intake/Output Summary (Last 24 hours) at 01/13/15 1942 Last data filed at 01/13/15 1708  Gross per 24 hour  Intake 1173.3 ml  Output   3325 ml  Net -2151.7 ml    Exam:   General:  Pt is more alert today, no distress  Cardiovascular: RRR, appreciate S1,S2  Respiratory: no wheezing, no crackles, no rhonchi  Abdomen: non distended, no tenderness, bowel sounds present  Extremities: pulses palpable, no leg swelling  Neuro: Nonfocal  Data Reviewed: Basic Metabolic Panel:  Recent Labs Lab 01/11/15 1208 01/12/15 0508 01/13/15 0421  NA 128* 127* 129*  K 3.5 3.5 4.0  CL 91* 94* 94*  CO2 23 25 30   GLUCOSE 124* 88 101*  BUN 12 13 8   CREATININE 0.65 0.57 1.610.62  CALCIUM 9.7 8.6 8.3*   Liver Function Tests:  Recent Labs Lab 01/11/15 1208  AST 66*  ALT 73*  ALKPHOS 76  BILITOT 0.6  PROT 8.1  ALBUMIN 4.3   No results for input(s): LIPASE, AMYLASE in the last 168 hours. No results for input(s): AMMONIA in the last 168 hours. CBC:  Recent Labs Lab 01/11/15 1208 01/12/15 0508 01/13/15 0421  WBC 10.5 7.4 8.2  HGB 14.2 13.1 12.9*  HCT 40.9 38.2* 38.0*  MCV 95.1 94.8 95.0  PLT 372 369 329   Cardiac Enzymes:  Recent Labs Lab 01/11/15 1741 01/11/15 2245 01/12/15 0508  TROPONINI <0.03 <0.03 <0.03   BNP: Invalid input(s): POCBNP CBG: No results for input(s): GLUCAP in the last 168 hours.  No results found for this or any previous visit (from the past 240 hour(s)).   Scheduled Meds: . folic acid  1 mg Oral Daily  . multivitamin with minerals  1 tablet Oral Daily  . sodium chloride  3 mL Intravenous Q12H  . thiamine  100 mg Oral Daily   Or  . thiamine  100 mg Intravenous Daily   Continuous Infusions: . sodium chloride 100 mL/hr at 01/13/15 0950

## 2015-01-18 NOTE — Discharge Summary (Signed)
Physician Discharge Summary  Peter Howell:[id number] DOB: 1961/01/15 DOA: 01/11/2015  PCP: pt usually follows with [facility] when medical issues arise per patient but he reports  No established PCP  Admit date: 01/11/2015 Discharge date: 01/18/2015  Recommendations for Outpatient Follow-up:  1. Pt left AMA around 8 pm. I was not present when pt left AMA  Discharge Diagnoses:  Principal Problem:   Syncope Active Problems:   Alcohol abuse   Hyponatremia   History of present illness:  54 -year-old male with past medical history significant for hypertension, alcohol abuse who presented to [facility] long hospital status post multiple falls, syncopal events. Patient was found to have alcohol level of 254. Urine drug screen negative. CT head on admission showed hematoma in parietal and occipital area but no acute intracranial findings.Blood work notable for hyponatremia.   Assessment/Plan:    Principal Problem: Syncope / acute alcohol intoxication - Syncope likely secondary to acute alcohol intoxication considering alcohol level of 254 on the admission. - Urine drug screen was within normal limits. Troponin levels within normal limits. - CT head and cervical spine showed hematoma in the parietal and occipital area; no acute intracranial findings. - No reports of withdrawals.  - Was on CIWA protocol in hospital   Active Problems: Hyponatremia - Likely secondary to alcohol abuse - Sodium level 127 on admission, 129 on 4/16. Improved with IV fluids     DVT Prophylaxis  - SCD's bilaterally    Code Status: Full.    IV access:  Peripheral IV  Procedures and diagnostic studies:   Dg Chest 2 View 01/11/2015 No active cardiopulmonary disease. Electronically Signed  By: Amie Portland M.D. On: 01/11/2015 13:20   Ct Head Wo Contrast 01/11/2015 Small right parieto-occipital scalp hematoma. No fracture. Mild small vessel disease in the periventricular white matter,  stable. No intracranial mass, hemorrhage, or extra-axial fluid collection. No midline shift. Electronically Signed By: Bretta Bang III M.D. On: 01/11/2015 16:21   Ct Head Wo Contrast 01/11/2015 CT head: Mild patchy periventricular small vessel disease. No intracranial mass, hemorrhage, or acute appearing infarct. No extra-axial fluid collections are identified. CT cervical spine: Multilevel osteoarthritic change, essentially stable left paracentral disc protrusions at C5-6 and C6-7 on the left for stable. No fracture or spondylolisthesis. There is carotid artery calcification bilaterally. Electronically Signed By: Bretta Bang III M.D. On: 01/11/2015 13:34   Ct Cervical Spine Wo Contrast 01/11/2015 CT head: Mild patchy periventricular small vessel disease. No intracranial mass, hemorrhage, or acute appearing infarct. No extra-axial fluid collections are identified. CT cervical spine: Multilevel osteoarthritic change, essentially stable left paracentral disc protrusions at C5-6 and C6-7 on the left for stable. No fracture or spondylolisthesis. There is carotid artery calcification bilaterally. Electronically Signed By: Bretta Bang III M.D. On: 01/11/2015 13:34    Medical Consultants:  None  Other Consultants:  Physical therapy  IAnti-Infectives:   None    Signed:  Manson Passey, MD  [facility] 01/18/2015, 8:39 PM  Pager #: [phone]    Discharge Exam: Filed Vitals:   01/13/15 1624  BP: 162/94  Pulse: 86  Temp: 97.8 F (36.6 C)  Resp: 18   Filed Vitals:   01/12/15 1444 01/12/15 2048 01/13/15 0549 01/13/15 1624  BP: 157/93 148/89 142/92 162/94  Pulse: 94 83 87 86  Temp: 98.6 F (37 C) 98 F (36.7 C) 98.3 F (36.8 C) 97.8 F (36.6 C)  TempSrc: Oral Oral Oral Oral  Resp: Height:  Weight:      SpO2: 95% 99% 95% 95%    PE: Pt left AMA   Discharge Instructions - not given pt left AMA      Medication List    Notice    You have not been prescribed any medications.        The results of significant diagnostics from this hospitalization (including imaging, microbiology, ancillary and laboratory) are listed below for reference.    Significant Diagnostic Studies: Dg Chest 2 View  01/11/2015   CLINICAL DATA:  Patient presents to the ED from home with complaints of headache for 2 weeks. Patient states Goody powder makes head pain better and makes nothing worse. Patient rates pain 8/10. Patient states he has had cough productive  EXAM: CHEST  2 VIEW  COMPARISON:  04/03/2014  FINDINGS: Cardiac silhouette normal in size and configuration. No mediastinal or hilar masses or convincing adenopathy.  Clear lungs.  No pleural effusion or pneumothorax.  Bony thorax is intact.  IMPRESSION: No active cardiopulmonary disease.   Electronically Signed   By: Amie Portlandavid  Ormond M.D.   On: 01/11/2015 13:20   Ct Head Wo Contrast  01/11/2015   CLINICAL DATA:  Pain following fall  EXAM: CT HEAD WITHOUT CONTRAST  TECHNIQUE: Contiguous axial images were obtained from the base of the skull through the vertex without intravenous contrast.  COMPARISON:  Study obtained earlier in the day  FINDINGS: The ventricles are normal in size and configuration. There is no intracranial mass, hemorrhage, extra-axial fluid collection, or midline shift. Patchy periventricular small vessel disease is stable. No new gray-white compartment lesion identified. There is a small right parieto-occipital scalp hematoma. Bony calvarium appears intact. The mastoid air cells are clear.  IMPRESSION: Small right parieto-occipital scalp hematoma. No fracture. Mild small vessel disease in the periventricular white matter, stable. No intracranial mass, hemorrhage, or extra-axial fluid collection. No midline shift.   Electronically Signed   By: Bretta BangWilliam  Woodruff III M.D.   On: 01/11/2015 16:21   Ct Head Wo Contrast  01/11/2015   CLINICAL DATA:   Recent fall with pain  EXAM: CT HEAD WITHOUT CONTRAST  CT CERVICAL SPINE WITHOUT CONTRAST  TECHNIQUE: Multidetector CT imaging of the head and cervical spine was performed following the standard protocol without intravenous contrast. Multiplanar CT image reconstructions of the cervical spine were also generated.  COMPARISON:  July 14, 2011  FINDINGS: CT HEAD FINDINGS  The ventricles are normal in size and configuration. There is no intracranial mass hemorrhage, extra-axial fluid collection, or midline shift. There is mild patchy periventricular small vessel disease in the centra semiovale bilaterally, stable. No new gray-white compartment lesion. No acute infarct apparent. Bony calvarium appears intact. The mastoid air cells are clear.  CT CERVICAL SPINE FINDINGS  There is no fracture or spondylolisthesis. Prevertebral soft tissues and predental space regions are normal. There is moderately severe disc space narrowing at C5-6 and C6-7. There are prominent anterior osteophytes at C5, C6, and C7. There is facet hypertrophy at multiple levels bilaterally. There is left paracentral disc protrusion, calcified, at C5-6 on the left with exit foraminal narrowing on the left at this level. Similar changes are present at C6-7 on the left with less exit foraminal narrowing on the left compared to the C5-6 level. There is no disc extrusion or high-grade stenosis. There are foci of carotid artery calcification bilaterally.  IMPRESSION: CT head: Mild patchy periventricular small vessel disease. No intracranial mass, hemorrhage, or acute appearing infarct. No extra-axial fluid collections  are identified.  CT cervical spine: Multilevel osteoarthritic change, essentially stable left paracentral disc protrusions at C5-6 and C6-7 on the left for stable. No fracture or spondylolisthesis. There is carotid artery calcification bilaterally.   Electronically Signed   By: Bretta Bang III M.D.   On: 01/11/2015 13:34   Ct Cervical  Spine Wo Contrast  01/11/2015   CLINICAL DATA:  Recent fall with pain  EXAM: CT HEAD WITHOUT CONTRAST  CT CERVICAL SPINE WITHOUT CONTRAST  TECHNIQUE: Multidetector CT imaging of the head and cervical spine was performed following the standard protocol without intravenous contrast. Multiplanar CT image reconstructions of the cervical spine were also generated.  COMPARISON:  July 14, 2011  FINDINGS: CT HEAD FINDINGS  The ventricles are normal in size and configuration. There is no intracranial mass hemorrhage, extra-axial fluid collection, or midline shift. There is mild patchy periventricular small vessel disease in the centra semiovale bilaterally, stable. No new gray-white compartment lesion. No acute infarct apparent. Bony calvarium appears intact. The mastoid air cells are clear.  CT CERVICAL SPINE FINDINGS  There is no fracture or spondylolisthesis. Prevertebral soft tissues and predental space regions are normal. There is moderately severe disc space narrowing at C5-6 and C6-7. There are prominent anterior osteophytes at C5, C6, and C7. There is facet hypertrophy at multiple levels bilaterally. There is left paracentral disc protrusion, calcified, at C5-6 on the left with exit foraminal narrowing on the left at this level. Similar changes are present at C6-7 on the left with less exit foraminal narrowing on the left compared to the C5-6 level. There is no disc extrusion or high-grade stenosis. There are foci of carotid artery calcification bilaterally.  IMPRESSION: CT head: Mild patchy periventricular small vessel disease. No intracranial mass, hemorrhage, or acute appearing infarct. No extra-axial fluid collections are identified.  CT cervical spine: Multilevel osteoarthritic change, essentially stable left paracentral disc protrusions at C5-6 and C6-7 on the left for stable. No fracture or spondylolisthesis. There is carotid artery calcification bilaterally.   Electronically Signed   By: Bretta Bang  III M.D.   On: 01/11/2015 13:34   Mr Brain Wo Contrast  01/12/2015   CLINICAL DATA:  Multiple falls and syncope.  Alcohol abuse.  EXAM: MRI HEAD WITHOUT CONTRAST  TECHNIQUE: Multiplanar, multiecho pulse sequences of the brain and surrounding structures were obtained without intravenous contrast.  COMPARISON:  CT head without contrast 01/11/2015.  FINDINGS: No acute infarct, hemorrhage, or mass lesion is present. Moderate will age advanced atrophy and white matter disease is present. White matter changes extend into the brainstem. The ventricles are of normal size. No significant extraaxial fluid collection is present.  Flow is present in the major intracranial arteries. A remote medial blowout fracture is present on the right. The globes and orbits are otherwise intact. Mild diffuse mucosal thickening is present throughout the paranasal sinuses. There are no fluid levels. The mastoid air cells are clear.  IMPRESSION: 1. No acute intracranial abnormality. 2. Moderate age advanced atrophy and white matter disease bilaterally with extension into the brainstem. This likely reflects the sequelae of chronic microvascular ischemia. Chronic alcohol abuse may contribute. 3. Mild diffuse sinus disease.   Electronically Signed   By: Marin Roberts M.D.   On: 01/12/2015 14:51    Microbiology: No results found for this or any previous visit (from the past 240 hour(s)).   Labs: Basic Metabolic Panel:  Recent Labs Lab 01/12/15 0508 01/13/15 0421  NA 127* 129*  K 3.5 4.0  CL 94* 94*  CO2 25 30  GLUCOSE 88 101*  BUN 13 8  CREATININE 0.57 0.62  CALCIUM 8.6 8.3*   Liver Function Tests: No results for input(s): AST, ALT, ALKPHOS, BILITOT, PROT, ALBUMIN in the last 168 hours. No results for input(s): LIPASE, AMYLASE in the last 168 hours. No results for input(s): AMMONIA in the last 168 hours. CBC:  Recent Labs Lab 01/12/15 0508 01/13/15 0421  WBC 7.4 8.2  HGB 13.1 12.9*  HCT 38.2* 38.0*  MCV  94.8 95.0  PLT 369 329   Cardiac Enzymes:  Recent Labs Lab 01/11/15 2245 01/12/15 0508  TROPONINI <0.03 <0.03   BNP: BNP (last 3 results) No results for input(s): BNP in the last 8760 hours.  ProBNP (last 3 results) No results for input(s): PROBNP in the last 8760 hours.  CBG: No results for input(s): GLUCAP in the last 168 hours.

## 2017-01-16 IMAGING — CT CT CERVICAL SPINE W/O CM
2 of 4 series · 6 of 14 positions shown, 7 images · non-contrast
Comparison: July 14, 2011

CLINICAL DATA: Recent fall with pain

EXAM:
CT HEAD WITHOUT CONTRAST
CT CERVICAL SPINE WITHOUT CONTRAST
TECHNIQUE: Multidetector CT imaging of the head and cervical spine was
performed following the standard protocol without intravenous
contrast. Multiplanar CT image reconstructions of the cervical spine
were also generated.

[Series 4: c-spine st · axial · 0.35mm/px · z∈[+1066,+1150]mm · 3 of 84 slices shown]
[im 21/84  bone]
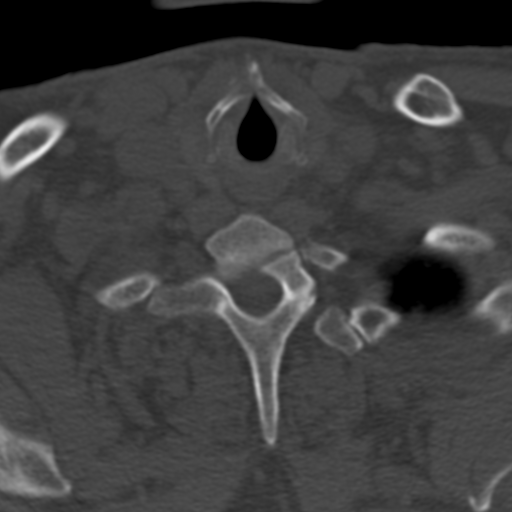
[im 42/84  bone]
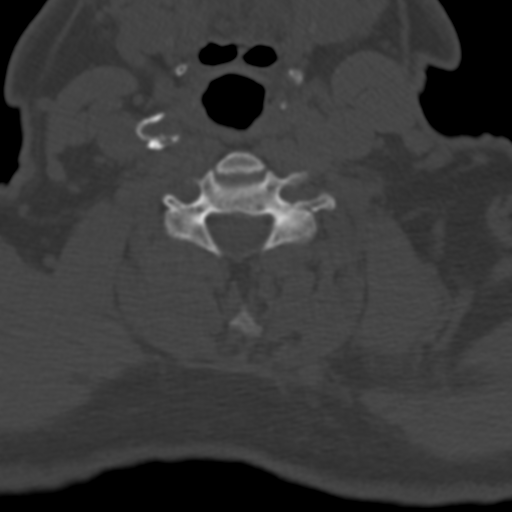
[im 63/84  bone]
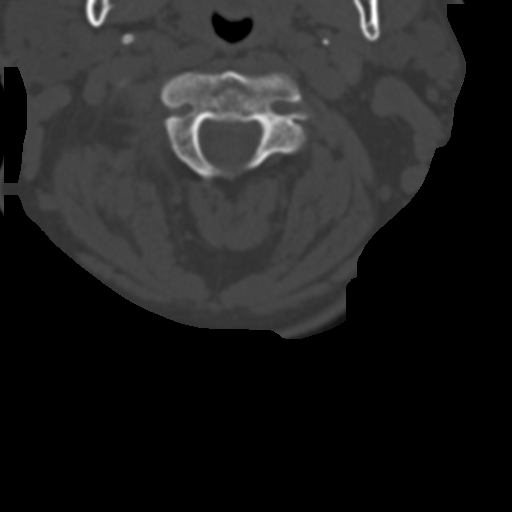

[Series 9: axial recon · axial · 0.23mm/px · z∈[+1040,+1116]mm · 3 of 88 slices shown, 4 images]
[im 22/88  soft-tissue]
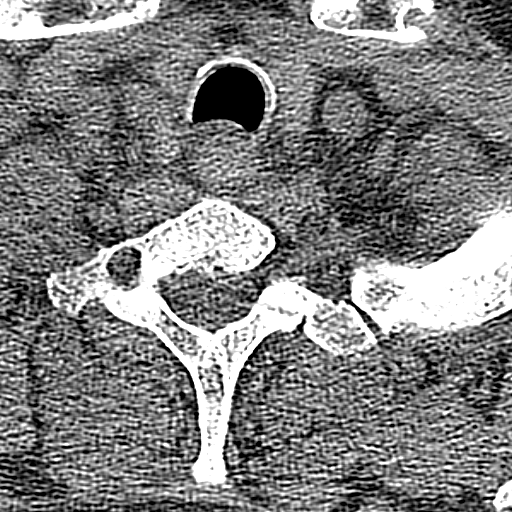
[im 22/88  bone]
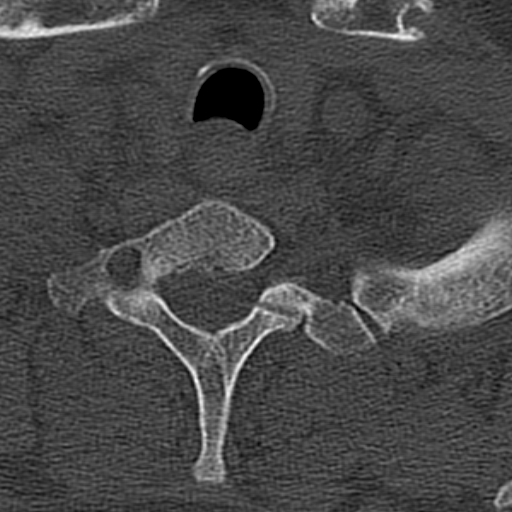
[im 44/88  bone]
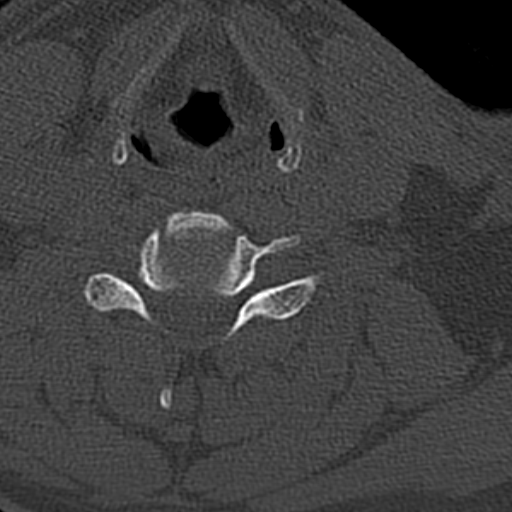
[im 66/88  bone]
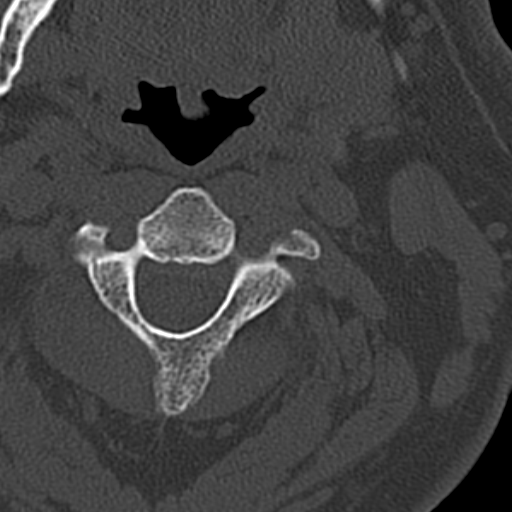

[6 of 14 positions shown; findings below may reference images not displayed]

FINDINGS: CT HEAD FINDINGS

The ventricles are normal in size and configuration. There is no
intracranial mass hemorrhage, extra-axial fluid collection, or
midline shift. There is mild patchy periventricular small vessel
disease in the centra semiovale bilaterally, stable. No new
gray-white compartment lesion. No acute infarct apparent. Bony
calvarium appears intact. The mastoid air cells are clear.

CT CERVICAL SPINE FINDINGS

There is no fracture or spondylolisthesis. Prevertebral soft tissues
and predental space regions are normal. There is moderately severe
disc space narrowing at C5-6 and C6-7. There are prominent anterior
osteophytes at C5, C6, and C7. There is facet hypertrophy at
multiple levels bilaterally. There is left paracentral disc
protrusion, calcified, at C5-6 on the left with exit foraminal
narrowing on the left at this level. Similar changes are present at
C6-7 on the left with less exit foraminal narrowing on the left
compared to the C5-6 level. There is no disc extrusion or high-grade
stenosis. There are foci of carotid artery calcification
bilaterally.
IMPRESSION: CT head: Mild patchy periventricular small vessel disease. No
intracranial mass, hemorrhage, or acute appearing infarct. No
extra-axial fluid collections are identified.

CT cervical spine: Multilevel osteoarthritic change, essentially
stable left paracentral disc protrusions at C5-6 and C6-7 on the
left for stable. No fracture or spondylolisthesis. There is carotid
artery calcification bilaterally.

## 2018-06-18 ENCOUNTER — Emergency Department (HOSPITAL_COMMUNITY): Payer: Medicaid Other

## 2018-06-18 ENCOUNTER — Encounter (HOSPITAL_COMMUNITY): Admission: EM | Disposition: E | Payer: Self-pay | Source: Home / Self Care | Attending: Pulmonary Disease

## 2018-06-18 ENCOUNTER — Encounter (HOSPITAL_COMMUNITY): Payer: Self-pay | Admitting: *Deleted

## 2018-06-18 ENCOUNTER — Inpatient Hospital Stay (HOSPITAL_COMMUNITY)
Admission: EM | Admit: 2018-06-18 | Discharge: 2018-06-29 | DRG: 291 | Disposition: E | Payer: Medicaid Other | Attending: Pulmonary Disease | Admitting: Pulmonary Disease

## 2018-06-18 ENCOUNTER — Inpatient Hospital Stay (HOSPITAL_COMMUNITY): Payer: Medicaid Other

## 2018-06-18 DIAGNOSIS — R339 Retention of urine, unspecified: Secondary | ICD-10-CM | POA: Diagnosis present

## 2018-06-18 DIAGNOSIS — E873 Alkalosis: Secondary | ICD-10-CM | POA: Diagnosis present

## 2018-06-18 DIAGNOSIS — I469 Cardiac arrest, cause unspecified: Secondary | ICD-10-CM | POA: Diagnosis present

## 2018-06-18 DIAGNOSIS — E872 Acidosis, unspecified: Secondary | ICD-10-CM

## 2018-06-18 DIAGNOSIS — Z66 Do not resuscitate: Secondary | ICD-10-CM | POA: Diagnosis present

## 2018-06-18 DIAGNOSIS — R402212 Coma scale, best verbal response, none, at arrival to emergency department: Secondary | ICD-10-CM | POA: Diagnosis present

## 2018-06-18 DIAGNOSIS — R4182 Altered mental status, unspecified: Secondary | ICD-10-CM | POA: Diagnosis not present

## 2018-06-18 DIAGNOSIS — G253 Myoclonus: Secondary | ICD-10-CM | POA: Diagnosis present

## 2018-06-18 DIAGNOSIS — E669 Obesity, unspecified: Secondary | ICD-10-CM | POA: Diagnosis present

## 2018-06-18 DIAGNOSIS — R570 Cardiogenic shock: Secondary | ICD-10-CM | POA: Diagnosis present

## 2018-06-18 DIAGNOSIS — J9601 Acute respiratory failure with hypoxia: Secondary | ICD-10-CM | POA: Diagnosis present

## 2018-06-18 DIAGNOSIS — Z0189 Encounter for other specified special examinations: Secondary | ICD-10-CM

## 2018-06-18 DIAGNOSIS — Z515 Encounter for palliative care: Secondary | ICD-10-CM

## 2018-06-18 DIAGNOSIS — F10129 Alcohol abuse with intoxication, unspecified: Secondary | ICD-10-CM | POA: Diagnosis present

## 2018-06-18 DIAGNOSIS — I10 Essential (primary) hypertension: Secondary | ICD-10-CM | POA: Diagnosis present

## 2018-06-18 DIAGNOSIS — F17210 Nicotine dependence, cigarettes, uncomplicated: Secondary | ICD-10-CM | POA: Diagnosis present

## 2018-06-18 DIAGNOSIS — J9600 Acute respiratory failure, unspecified whether with hypoxia or hypercapnia: Secondary | ICD-10-CM

## 2018-06-18 DIAGNOSIS — G931 Anoxic brain damage, not elsewhere classified: Secondary | ICD-10-CM | POA: Diagnosis present

## 2018-06-18 DIAGNOSIS — E876 Hypokalemia: Secondary | ICD-10-CM | POA: Diagnosis present

## 2018-06-18 DIAGNOSIS — Z8249 Family history of ischemic heart disease and other diseases of the circulatory system: Secondary | ICD-10-CM

## 2018-06-18 DIAGNOSIS — Z6828 Body mass index (BMI) 28.0-28.9, adult: Secondary | ICD-10-CM

## 2018-06-18 DIAGNOSIS — R402312 Coma scale, best motor response, none, at arrival to emergency department: Secondary | ICD-10-CM | POA: Diagnosis present

## 2018-06-18 DIAGNOSIS — Z590 Homelessness: Secondary | ICD-10-CM | POA: Diagnosis not present

## 2018-06-18 DIAGNOSIS — I468 Cardiac arrest due to other underlying condition: Secondary | ICD-10-CM | POA: Diagnosis not present

## 2018-06-18 DIAGNOSIS — R402112 Coma scale, eyes open, never, at arrival to emergency department: Secondary | ICD-10-CM | POA: Diagnosis present

## 2018-06-18 DIAGNOSIS — Z79899 Other long term (current) drug therapy: Secondary | ICD-10-CM | POA: Diagnosis not present

## 2018-06-18 LAB — CBC
HCT: 44.3 % (ref 39.0–52.0)
HEMOGLOBIN: 14.5 g/dL (ref 13.0–17.0)
MCH: 32.8 pg (ref 26.0–34.0)
MCHC: 32.7 g/dL (ref 30.0–36.0)
MCV: 100.2 fL — AB (ref 78.0–100.0)
PLATELETS: 163 10*3/uL (ref 150–400)
RBC: 4.42 MIL/uL (ref 4.22–5.81)
RDW: 14.7 % (ref 11.5–15.5)
WBC: 21 10*3/uL — AB (ref 4.0–10.5)

## 2018-06-18 LAB — I-STAT ARTERIAL BLOOD GAS, ED
ACID-BASE DEFICIT: 15 mmol/L — AB (ref 0.0–2.0)
Acid-base deficit: 6 mmol/L — ABNORMAL HIGH (ref 0.0–2.0)
BICARBONATE: 19.3 mmol/L — AB (ref 20.0–28.0)
Bicarbonate: 13.8 mmol/L — ABNORMAL LOW (ref 20.0–28.0)
O2 SAT: 100 %
O2 Saturation: 100 %
PH ART: 7.148 — AB (ref 7.350–7.450)
TCO2: 15 mmol/L — ABNORMAL LOW (ref 22–32)
TCO2: 20 mmol/L — AB (ref 22–32)
pCO2 arterial: 39.4 mmHg (ref 32.0–48.0)
pCO2 arterial: 35.3 mmHg (ref 32.0–48.0)
pH, Arterial: 7.343 — ABNORMAL LOW (ref 7.350–7.450)
pO2, Arterial: 518 mmHg — ABNORMAL HIGH (ref 83.0–108.0)
pO2, Arterial: 490 mmHg — ABNORMAL HIGH (ref 83.0–108.0)

## 2018-06-18 LAB — URINALYSIS, ROUTINE W REFLEX MICROSCOPIC
BILIRUBIN URINE: NEGATIVE
Glucose, UA: NEGATIVE mg/dL
Ketones, ur: NEGATIVE mg/dL
Leukocytes, UA: NEGATIVE
Nitrite: NEGATIVE
PROTEIN: 100 mg/dL — AB
Specific Gravity, Urine: 1.015 (ref 1.005–1.030)
pH: 5 (ref 5.0–8.0)

## 2018-06-18 LAB — CBC WITH DIFFERENTIAL/PLATELET
Abs Immature Granulocytes: 0.7 10*3/uL — ABNORMAL HIGH (ref 0.0–0.1)
Basophils Absolute: 0.1 10*3/uL (ref 0.0–0.1)
Basophils Relative: 1 %
EOS PCT: 0 %
Eosinophils Absolute: 0 10*3/uL (ref 0.0–0.7)
HCT: 48.6 % (ref 39.0–52.0)
HEMOGLOBIN: 15.5 g/dL (ref 13.0–17.0)
Immature Granulocytes: 5 %
Lymphocytes Relative: 12 %
Lymphs Abs: 1.9 10*3/uL (ref 0.7–4.0)
MCH: 32.6 pg (ref 26.0–34.0)
MCHC: 31.9 g/dL (ref 30.0–36.0)
MCV: 102.3 fL — ABNORMAL HIGH (ref 78.0–100.0)
MONO ABS: 0.6 10*3/uL (ref 0.1–1.0)
Monocytes Relative: 4 %
Neutro Abs: 12 10*3/uL — ABNORMAL HIGH (ref 1.7–7.7)
Neutrophils Relative %: 78 %
Platelets: 219 10*3/uL (ref 150–400)
RBC: 4.75 MIL/uL (ref 4.22–5.81)
RDW: 14.6 % (ref 11.5–15.5)
WBC: 15.2 10*3/uL — AB (ref 4.0–10.5)

## 2018-06-18 LAB — POCT I-STAT 3, ART BLOOD GAS (G3+)
ACID-BASE DEFICIT: 5 mmol/L — AB (ref 0.0–2.0)
Bicarbonate: 16.9 mmol/L — ABNORMAL LOW (ref 20.0–28.0)
O2 Saturation: 100 %
PH ART: 7.448 (ref 7.350–7.450)
TCO2: 18 mmol/L — AB (ref 22–32)
pCO2 arterial: 24.5 mmHg — ABNORMAL LOW (ref 32.0–48.0)
pO2, Arterial: 294 mmHg — ABNORMAL HIGH (ref 83.0–108.0)

## 2018-06-18 LAB — MRSA PCR SCREENING: MRSA BY PCR: NEGATIVE

## 2018-06-18 LAB — PROTIME-INR
INR: 1.18
INR: 1.27
PROTHROMBIN TIME: 14.9 s (ref 11.4–15.2)
PROTHROMBIN TIME: 15.8 s — AB (ref 11.4–15.2)

## 2018-06-18 LAB — TYPE AND SCREEN
ABO/RH(D): A POS
ANTIBODY SCREEN: NEGATIVE

## 2018-06-18 LAB — COMPREHENSIVE METABOLIC PANEL
ALBUMIN: 4.1 g/dL (ref 3.5–5.0)
ALK PHOS: 93 U/L (ref 38–126)
ALT: 402 U/L — ABNORMAL HIGH (ref 0–44)
ALT: 344 U/L — AB (ref 0–44)
AST: 597 U/L — ABNORMAL HIGH (ref 15–41)
AST: 713 U/L — ABNORMAL HIGH (ref 15–41)
Albumin: 3.5 g/dL (ref 3.5–5.0)
Alkaline Phosphatase: 106 U/L (ref 38–126)
Anion gap: 30 — ABNORMAL HIGH (ref 5–15)
Anion gap: 18 — ABNORMAL HIGH (ref 5–15)
BUN: 8 mg/dL (ref 6–20)
BUN: 8 mg/dL (ref 6–20)
CALCIUM: 8.1 mg/dL — AB (ref 8.9–10.3)
CHLORIDE: 103 mmol/L (ref 98–111)
CO2: 9 mmol/L — ABNORMAL LOW (ref 22–32)
CO2: 19 mmol/L — AB (ref 22–32)
CREATININE: 1.21 mg/dL (ref 0.61–1.24)
Calcium: 9 mg/dL (ref 8.9–10.3)
Chloride: 101 mmol/L (ref 98–111)
Creatinine, Ser: 1.35 mg/dL — ABNORMAL HIGH (ref 0.61–1.24)
GFR calc Af Amer: >60 mL/min (ref >60)
GFR calc Af Amer: >60 mL/min (ref >60)
GFR calc non Af Amer: 57 mL/min — ABNORMAL LOW (ref >60)
GFR calc non Af Amer: >60 mL/min (ref >60)
Glucose, Bld: 134 mg/dL — ABNORMAL HIGH (ref 70–99)
Glucose, Bld: 167 mg/dL — ABNORMAL HIGH (ref 70–99)
Potassium: 5.1 mmol/L (ref 3.5–5.1)
Potassium: 4.5 mmol/L (ref 3.5–5.1)
SODIUM: 140 mmol/L (ref 135–145)
Sodium: 140 mmol/L (ref 135–145)
Total Bilirubin: 0.9 mg/dL (ref 0.3–1.2)
Total Bilirubin: 1.4 mg/dL — ABNORMAL HIGH (ref 0.3–1.2)
Total Protein: 7.6 g/dL (ref 6.5–8.1)
Total Protein: 6.5 g/dL (ref 6.5–8.1)

## 2018-06-18 LAB — HEPARIN LEVEL (UNFRACTIONATED): Heparin Unfractionated: 0.3 IU/mL (ref 0.30–0.70)

## 2018-06-18 LAB — RAPID URINE DRUG SCREEN, HOSP PERFORMED
Amphetamines: NOT DETECTED
BENZODIAZEPINES: POSITIVE — AB
Barbiturates: NOT DETECTED
COCAINE: POSITIVE — AB
Opiates: NOT DETECTED
Tetrahydrocannabinol: NOT DETECTED

## 2018-06-18 LAB — I-STAT CHEM 8, ED
BUN: 7 mg/dL (ref 6–20)
CHLORIDE: 104 mmol/L (ref 98–111)
CREATININE: 1 mg/dL (ref 0.61–1.24)
Calcium, Ion: 1.06 mmol/L — ABNORMAL LOW (ref 1.15–1.40)
GLUCOSE: 131 mg/dL — AB (ref 70–99)
HCT: 49 % (ref 39.0–52.0)
HEMOGLOBIN: 16.7 g/dL (ref 13.0–17.0)
POTASSIUM: 4.9 mmol/L (ref 3.5–5.1)
Sodium: 138 mmol/L (ref 135–145)
TCO2: 14 mmol/L — ABNORMAL LOW (ref 22–32)

## 2018-06-18 LAB — GLUCOSE, CAPILLARY: Glucose-Capillary: 182 mg/dL — ABNORMAL HIGH (ref 70–99)

## 2018-06-18 LAB — I-STAT TROPONIN, ED: Troponin i, poc: 1.32 ng/mL (ref 0.00–0.08)

## 2018-06-18 LAB — TROPONIN I
TROPONIN I: 21.26 ng/mL — AB (ref <0.03)
Troponin I: 1.75 ng/mL (ref <0.03)
Troponin I: 12.59 ng/mL (ref <0.03)

## 2018-06-18 LAB — ACETAMINOPHEN LEVEL

## 2018-06-18 LAB — I-STAT CG4 LACTIC ACID, ED
LACTIC ACID, VENOUS: 8.55 mmol/L — AB (ref 0.5–1.9)
Lactic Acid, Venous: 16.35 mmol/L (ref 0.5–1.9)

## 2018-06-18 LAB — MAGNESIUM: Magnesium: 1.9 mg/dL (ref 1.7–2.4)

## 2018-06-18 LAB — BRAIN NATRIURETIC PEPTIDE: B NATRIURETIC PEPTIDE 5: 152.8 pg/mL — AB (ref 0.0–100.0)

## 2018-06-18 LAB — LACTIC ACID, PLASMA: Lactic Acid, Venous: 5.8 mmol/L (ref 0.5–1.9)

## 2018-06-18 LAB — ETHANOL

## 2018-06-18 LAB — APTT: aPTT: >200 seconds (ref 24–36)

## 2018-06-18 LAB — CORTISOL: Cortisol, Plasma: 71.4 ug/dL

## 2018-06-18 LAB — TRIGLYCERIDES: TRIGLYCERIDES: 87 mg/dL (ref <150)

## 2018-06-18 LAB — PROCALCITONIN: Procalcitonin: 0.6 ng/mL

## 2018-06-18 LAB — PHOSPHORUS: PHOSPHORUS: 5.3 mg/dL — AB (ref 2.5–4.6)

## 2018-06-18 LAB — SALICYLATE LEVEL

## 2018-06-18 SURGERY — LEFT HEART CATH AND CORONARY ANGIOGRAPHY
Anesthesia: LOCAL

## 2018-06-18 MED ORDER — MIDAZOLAM HCL 2 MG/2ML IJ SOLN
INTRAMUSCULAR | Status: AC
  Filled 2018-06-18: qty 6

## 2018-06-18 MED ORDER — SODIUM CHLORIDE 0.9 % IV BOLUS
1000.0000 mL | Freq: Once | INTRAVENOUS | Status: AC
  Administered 2018-06-18: 1000 mL via INTRAVENOUS

## 2018-06-18 MED ORDER — THIAMINE HCL 100 MG/ML IJ SOLN
Freq: Once | INTRAVENOUS | Status: AC
  Administered 2018-06-18: 15:00:00 via INTRAVENOUS
  Filled 2018-06-18: qty 1000

## 2018-06-18 MED ORDER — NOREPINEPHRINE 4 MG/250ML-% IV SOLN
INTRAVENOUS | Status: AC
  Administered 2018-06-18: 25 mg
  Filled 2018-06-18: qty 250

## 2018-06-18 MED ORDER — CHLORHEXIDINE GLUCONATE 0.12% ORAL RINSE (MEDLINE KIT)
15.0000 mL | Freq: Two times a day (BID) | OROMUCOSAL | Status: DC
  Administered 2018-06-18 – 2018-06-19 (×3): 15 mL via OROMUCOSAL

## 2018-06-18 MED ORDER — SODIUM CHLORIDE 0.9 % IV BOLUS: 1000.0000 mL | Freq: Once | INTRAVENOUS | Status: DC

## 2018-06-18 MED ORDER — NOREPINEPHRINE 16 MG/250ML-% IV SOLN
0.0000 ug/min | INTRAVENOUS | Status: DC
  Administered 2018-06-19: 15 ug/min via INTRAVENOUS
  Filled 2018-06-18: qty 250

## 2018-06-18 MED ORDER — MIDAZOLAM HCL 2 MG/2ML IJ SOLN
2.0000 mg | INTRAMUSCULAR | Status: DC | PRN
  Administered 2018-06-18 (×2): 2 mg via INTRAVENOUS
  Filled 2018-06-18: qty 2

## 2018-06-18 MED ORDER — PROPOFOL 1000 MG/100ML IV EMUL
5.0000 ug/kg/min | INTRAVENOUS | Status: DC
  Administered 2018-06-18: 10 ug/kg/min via INTRAVENOUS

## 2018-06-18 MED ORDER — HEPARIN SODIUM (PORCINE) 5000 UNIT/ML IJ SOLN: 5000.0000 [IU] | Freq: Three times a day (TID) | INTRAMUSCULAR | Status: DC

## 2018-06-18 MED ORDER — FAMOTIDINE IN NACL 20-0.9 MG/50ML-% IV SOLN
20.0000 mg | Freq: Two times a day (BID) | INTRAVENOUS | Status: DC
  Administered 2018-06-18 – 2018-06-19 (×2): 20 mg via INTRAVENOUS
  Filled 2018-06-18 (×3): qty 50

## 2018-06-18 MED ORDER — HEPARIN (PORCINE) IN NACL 100-0.45 UNIT/ML-% IJ SOLN
950.0000 [IU]/h | INTRAMUSCULAR | Status: DC
  Administered 2018-06-18 – 2018-06-19 (×2): 950 [IU]/h via INTRAVENOUS
  Filled 2018-06-18 (×2): qty 250

## 2018-06-18 MED ORDER — LACTATED RINGERS IV BOLUS: 1000.0000 mL | Freq: Once | INTRAVENOUS | Status: DC

## 2018-06-18 MED ORDER — SODIUM CHLORIDE 0.9 % IV SOLN: 250.0000 mL | INTRAVENOUS | Status: DC | PRN

## 2018-06-18 MED ORDER — MIDAZOLAM HCL 2 MG/2ML IJ SOLN
INTRAMUSCULAR | Status: AC
  Filled 2018-06-18: qty 2

## 2018-06-18 MED ORDER — MIDAZOLAM HCL 5 MG/5ML IJ SOLN
INTRAMUSCULAR | Status: AC | PRN
  Administered 2018-06-18: 5 mg via INTRAVENOUS

## 2018-06-18 MED ORDER — ORAL CARE MOUTH RINSE
15.0000 mL | OROMUCOSAL | Status: DC
  Administered 2018-06-18 – 2018-06-19 (×10): 15 mL via OROMUCOSAL

## 2018-06-18 MED ORDER — LEVETIRACETAM IN NACL 1000 MG/100ML IV SOLN
1000.0000 mg | Freq: Two times a day (BID) | INTRAVENOUS | Status: DC
  Administered 2018-06-18 – 2018-06-19 (×3): 1000 mg via INTRAVENOUS
  Filled 2018-06-18 (×4): qty 100

## 2018-06-18 MED ORDER — HEPARIN BOLUS VIA INFUSION
4000.0000 [IU] | Freq: Once | INTRAVENOUS | Status: AC
  Administered 2018-06-18: 4000 [IU] via INTRAVENOUS
  Filled 2018-06-18: qty 4000

## 2018-06-18 MED ORDER — SODIUM BICARBONATE 8.4 % IV SOLN
INTRAVENOUS | Status: AC | PRN
  Administered 2018-06-18: 100 meq via INTRAVENOUS

## 2018-06-18 MED ORDER — NOREPINEPHRINE 4 MG/250ML-% IV SOLN
0.0000 ug/min | INTRAVENOUS | Status: DC
  Administered 2018-06-18: 25 ug/min via INTRAVENOUS
  Administered 2018-06-18: 3 ug/min via INTRAVENOUS
  Filled 2018-06-18 (×2): qty 250

## 2018-06-18 MED ORDER — PROPOFOL 1000 MG/100ML IV EMUL
5.0000 ug/kg/min | INTRAVENOUS | Status: DC
  Administered 2018-06-18 (×2): 70 ug/kg/min via INTRAVENOUS
  Administered 2018-06-19: 55 ug/kg/min via INTRAVENOUS
  Administered 2018-06-19 (×2): 70 ug/kg/min via INTRAVENOUS
  Administered 2018-06-19: 60 ug/kg/min via INTRAVENOUS
  Administered 2018-06-19 (×2): 70 ug/kg/min via INTRAVENOUS
  Filled 2018-06-18 (×10): qty 100

## 2018-06-18 MED ORDER — ROCURONIUM BROMIDE 50 MG/5ML IV SOLN
INTRAVENOUS | Status: AC | PRN
  Administered 2018-06-18: 50 mg via INTRAVENOUS

## 2018-06-18 NOTE — Progress Notes (Signed)
This is continued support. Family was identified and now in Consultation B.  They have spoken with ED physician.  We will follow as needed.
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
## 2018-06-18 NOTE — Procedures (Signed)
Date of recording 11/15/2017  Referring provider Minor, Chrissie NoaWilliam  Reason for the study Altered mental status, unresponsiveness, history of cardiac arrest  Technical Digital EEG recording using 10-20 international electrode system  Description of the recording No significant posterior background EEG activity seen. EEG comprised of burst suppression pattern with intermittent generalized spike and wave discharges.  Impression The EEG isabnormalwith a burst suppression pattern, these findings are can be seen with propofol or severe generalized cerebral dysfunction,  also showing signs of generalized epileptogenic dysfunction without any active electrographic seizures.
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
## 2018-06-18 NOTE — Progress Notes (Signed)
STAT EEG completed; results pending. Dr Desmond Lopeurk notified.

## 2018-06-18 NOTE — ED Notes (Signed)
All belongings given to pt's sister. Pt had a back pack, the blue jeans that were cut off which had $122.70 by way of 6-20 dollar bills, 2-2 dollar bills, 2-quarters and 2-dimes. Pt also has yellow boots and a pack of cigarettes.
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
## 2018-06-18 NOTE — ED Notes (Addendum)
Lab called with Trop of 1.75. Critical care in room.

## 2018-06-18 NOTE — Progress Notes (Signed)
ANTICOAGULATION CONSULT NOTE - Initial Consult  Pharmacy Consult for heparin Indication: chest pain/ACS  No Known Allergies  Patient Measurements: Height: 5\' 6"  (167.6 cm) Weight: 174 lb 13.2 oz (79.3 kg) IBW/kg (Calculated) : 63.8 Heparin Dosing Weight: 79.3 kg  Vital Signs: Temp: 97.4 F (36.3 C) (09/20 1044) Temp Source: Temporal (09/20 1044) BP: 140/98 (09/20 1234) Pulse Rate: 141 (09/20 1234)  Labs: Recent Labs    Nov 07, 2017 1034 Nov 07, 2017 1049  HGB 15.5 16.7  HCT 48.6 49.0  PLT 219  --   LABPROT 14.9  --   INR 1.18  --   CREATININE 1.35* 1.00  TROPONINI 1.75*  --     Estimated Creatinine Clearance: 80.7 mL/min (by C-G formula based on SCr of 1 mg/dL).   Medical History: Past Medical History:  Diagnosis Date  . Hypertension     Medications:  Scheduled:  . heparin  5,000 Units Subcutaneous Q8H  . midazolam        Assessment: 57 yom found unconscious in front of business; AED detected shockable rhythm. Underwent cardiac arrest as asystole then PEA (14 minutes of CPR prior to ROSC).   Hgb 16.7, plt 219. Troponin 1.32>1.75. No s/sx of bleeding. CT head negative for acute infarct or hemorrhage. No anticoag PTA.   Goal of Therapy:  Heparin level 0.3-0.7 units/ml Monitor platelets by anticoagulation protocol: Yes   Plan:  Give 4000 units bolus x 1 Start heparin infusion at 950 units/hr Check anti-Xa level in 6 hours and daily while on heparin Continue to monitor H&H and platelets  Girard CooterKimberly Perkins, PharmD Clinical Pharmacist  Pager: [phone] Phone: [phone] 09-26-18,12:36 PM

## 2018-06-18 NOTE — ED Notes (Signed)
Family in consultation room updated on pt's condition. Pt will go to ICU as soon as a bed is available. Pt is DNR.

## 2018-06-18 NOTE — Procedures (Signed)
Central Venous Catheter Insertion Procedure Note Peter LucksLindsey M Howell [id number] [birth year]  Procedure: Insertion of Central Venous Catheter Indications: Assessment of intravascular volume, Drug and/or fluid administration and Frequent blood sampling  Procedure Details Consent: Unable to obtain consent because of emergent medical necessity. Time Out: Verified patient identification, verified procedure, site/side was marked, verified correct patient position, special equipment/implants available, medications/allergies/relevent history reviewed, required imaging and test results available.  Performed  Maximum sterile technique was used including antiseptics, cap, gloves, gown, hand hygiene, mask and sheet. Skin prep: Chlorhexidine; local anesthetic administered A antimicrobial bonded/coated triple lumen catheter was placed in the left internal jugular vein using the Seldinger technique.  Evaluation Blood flow good Complications: No apparent complications Patient did tolerate procedure well. Chest X-ray ordered to verify placement.  CXR: pending.  Peter Howell 06/15/2018, 12:20 PM

## 2018-06-18 NOTE — Procedures (Signed)
Arterial Catheter Insertion Procedure Note Jenel LucksLindsey M Ogata [id number] [birth year]  Procedure: Insertion of Arterial Catheter  Indications: Blood pressure monitoring and Frequent blood sampling  Procedure Details Consent: Unable to obtain consent because of emergent medical necessity. Time Out: Verified patient identification, verified procedure, site/side was marked, verified correct patient position, special equipment/implants available, medications/allergies/relevent history reviewed, required imaging and test results available.  Performed  Maximum sterile technique was used including antiseptics, cap, gloves, gown, hand hygiene, mask and sheet. Skin prep: Chlorhexidine; local anesthetic administered 20 gauge catheter was inserted into right radial artery using the Seldinger technique. ULTRASOUND GUIDANCE USED: NO Evaluation Blood flow good; BP tracing good. Complications: No apparent complications.   Koren BoundYACOUB,[place] Berkey 05/31/2018

## 2018-06-18 NOTE — ED Notes (Signed)
Family at bedside. 

## 2018-06-18 NOTE — ED Notes (Signed)
Elevated trop of 1.32 reported to Dr. Criss AlvineGoldston

## 2018-06-18 NOTE — Progress Notes (Signed)
Initial Nutrition Assessment  DOCUMENTATION CODES:   Not applicable  INTERVENTION:   If TF warranted, recommend: - Vital AF 1.2 @ 40 ml/hr (960 ml/day) - 30 ml Pro-stat TID  Tube feeding regimen provides 1452 kcal, 117 grams of protein, and 778 ml of H2O.   Tube feeding regimen and current propofol provides 2331 total kcal (99% of needs)  NUTRITION DIAGNOSIS:   Inadequate oral intake related to inability to eat as evidenced by NPO status.  GOAL:   Patient will meet greater than or equal to 90% of their needs  MONITOR:   Vent status, Labs, Weight trends, I & O's  REASON FOR ASSESSMENT:   Ventilator    ASSESSMENT:   57 year old male who presented to the ED after cardiac arrest. Pt was found unresponsive outside of a building. Pt was intubated by EMS. PMH significant for EtOH abuse, homelessness, and hypertension.  Per Critical Care MD note, may start TF in the AM depending on pt's stability.  Per Neurology, abnormal EEG with a burst suppression pattern.  Patient is currently intubated on ventilator support. OG tube in stomach. MV: 27.2 L/min Temp (24hrs), Avg:99.1 F (37.3 C), Min:97.4 F (36.3 C), Max:100.8 F (38.2 C)  Propofol: 33.3 ml/hr (provides 879 kcal/day) Levophed: 75 ml/h  Medications reviewed and include: 20 mg IV Pepcid BID, Keppra, 100 mg thiamin, 1 mg folic acid, adult MVI  Labs reviewed:   NUTRITION - FOCUSED PHYSICAL EXAM:    Most Recent Value  Orbital Region  No depletion  Upper Arm Region  No depletion  Thoracic and Lumbar Region  Unable to assess  Buccal Region  Unable to assess  Temple Region  No depletion  Clavicle Bone Region  No depletion  Clavicle and Acromion Bone Region  No depletion  Scapular Bone Region  Unable to assess  Patellar Region  Unable to assess  Anterior Thigh Region  Unable to assess  Posterior Calf Region  Unable to assess  Edema (RD Assessment)  Mild [generalized]  Hair  Reviewed  Eyes  Unable to assess   Mouth  Unable to assess  Skin  Reviewed  Nails  Reviewed       Diet Order:   Diet Order            Diet NPO time specified  Diet effective now              EDUCATION NEEDS:   Not appropriate for education at this time  Skin:  Skin Assessment: Reviewed RN Assessment  Last BM:  unknown/PTA  Height:   Ht Readings from Last 1 Encounters:  07-26-2018 5\' 6"  (1.676 m)    Weight:   Wt Readings from Last 1 Encounters:  07-26-2018 79.3 kg    Ideal Body Weight:  64.55 kg  BMI:  Body mass index is 28.22 kg/m.  Estimated Nutritional Needs:   Kcal:  2362  Protein:  115-130 grams  Fluid:  >/= 2.0 L    Earma ReadingKate Jablonski Armany Mano, MS, RD, LDN Inpatient Clinical Dietitian Pager: [phone] Weekend/After Hours: [phone]

## 2018-06-18 NOTE — Progress Notes (Signed)
[place] for heparin Indication: chest pain/ACS  No Known Allergies  Patient Measurements: Height: _0  (167.6 cm) Weight: 174 lb 13.2 oz (79.3 kg) IBW/kg (Calculated) : 63.8 Heparin Dosing Weight: 79.3 kg  Vital Signs: Temp: 97.9 F (36.6 C) (09/20 2019) Temp Source: Axillary (09/20 2019) BP: 113/82 (09/20 2000) Pulse Rate: 90 (09/20 2000)  Labs: Recent Labs    06/28/2018 1034 06/02/2018 1049 06/13/2018 1320 06/05/2018 1900  HGB 15.5 16.7 14.5  --   HCT 48.6 49.0 44.3  --   PLT 219  --  163  --   APTT  --   --  >200*  --   LABPROT 14.9  --  15.8*  --   INR 1.18  --  1.27  --   HEPARINUNFRC  --   --   --  0.30  CREATININE 1.35* 1.00 1.21  --   TROPONINI 1.75*  --  12.59*  --     Estimated Creatinine Clearance: 66.7 mL/min (by C-G formula based on SCr of 1.21 mg/dL).   Medical History: Past Medical History:  Diagnosis Date  . Hypertension     Medications:  Scheduled:  . chlorhexidine gluconate (MEDLINE KIT)  15 mL Mouth Rinse BID  . mouth rinse  15 mL Mouth Rinse 10 times per day  . midazolam      . midazolam        Assessment: 57 yom found unconscious in front of business; AED detected shockable rhythm. Underwent cardiac arrest as asystole then PEA (14 minutes of CPR prior to [place]).   Hgb 16.7, plt 219. Troponin 1.32>1.75. No s/sx of bleeding. CT head negative for acute infarct or hemorrhage. No anticoag PTA.   Heparin level this evening is within goal range.  No over bleeding or complications noted.  Goal of Therapy:  Heparin level 0.3-0.7 units/ml Monitor platelets by anticoagulation protocol: Yes   Plan:  Continue IV heparin at current rate. Recheck heparin level with AM labs. Daily heparin level and CBC.  Marguerite Olea, [facility] Clinical Pharmacist Phone [phone]  06/22/2018 9:00 PM
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
## 2018-06-18 NOTE — Progress Notes (Signed)
RT NOTES: Patient transported to 8M11 on vent without incident. Report given to 8M RT.

## 2018-06-18 NOTE — ED Notes (Signed)
Pt having myoclonic jerks every 15 seconds, ICU provider ordered 50mg  of rocuronium

## 2018-06-18 NOTE — Progress Notes (Signed)
Responded to STEMI.  Pt. Going for scan.  Per nurse, no identified family. Pt. Homeless.  Chaplain will follow as needed. [place] [employer]

## 2018-06-18 NOTE — Progress Notes (Signed)
Pt arrives via EMS.  #8 ETT in place secured 22@lip .  ETT advanced to 27, xray confirmed with good placement.  Pt placed on vent per MD.

## 2018-06-18 NOTE — Code Documentation (Addendum)
Arterial line is in[facility].

## 2018-06-19 LAB — BASIC METABOLIC PANEL
Anion gap: 14 (ref 5–15)
BUN: 14 mg/dL (ref 6–20)
CALCIUM: 7.9 mg/dL — AB (ref 8.9–10.3)
CHLORIDE: 110 mmol/L (ref 98–111)
CO2: 15 mmol/L — ABNORMAL LOW (ref 22–32)
CREATININE: 0.96 mg/dL (ref 0.61–1.24)
GFR calc non Af Amer: >60 mL/min (ref >60)
Glucose, Bld: 161 mg/dL — ABNORMAL HIGH (ref 70–99)
Potassium: 3.1 mmol/L — ABNORMAL LOW (ref 3.5–5.1)
Sodium: 139 mmol/L (ref 135–145)

## 2018-06-19 LAB — POCT I-STAT 3, ART BLOOD GAS (G3+)
Acid-Base Excess: 2 mmol/L (ref 0.0–2.0)
Acid-Base Excess: 1 mmol/L (ref 0.0–2.0)
BICARBONATE: 19.7 mmol/L — AB (ref 20.0–28.0)
BICARBONATE: 20.5 mmol/L (ref 20.0–28.0)
O2 SAT: 100 %
O2 Saturation: 100 %
PH ART: 7.669 — AB (ref 7.350–7.450)
TCO2: 20 mmol/L — AB (ref 22–32)
TCO2: 21 mmol/L — AB (ref 22–32)
pCO2 arterial: 17.2 mmHg — CL (ref 32.0–48.0)
pCO2 arterial: 21.7 mmHg — ABNORMAL LOW (ref 32.0–48.0)
pH, Arterial: 7.585 — ABNORMAL HIGH (ref 7.350–7.450)
pO2, Arterial: 277 mmHg — ABNORMAL HIGH (ref 83.0–108.0)
pO2, Arterial: 182 mmHg — ABNORMAL HIGH (ref 83.0–108.0)

## 2018-06-19 LAB — TROPONIN I: Troponin I: 18.93 ng/mL (ref <0.03)

## 2018-06-19 LAB — CBC
HCT: 39.8 % (ref 39.0–52.0)
HEMOGLOBIN: 13.9 g/dL (ref 13.0–17.0)
MCH: 32.6 pg (ref 26.0–34.0)
MCHC: 34.9 g/dL (ref 30.0–36.0)
MCV: 93.4 fL (ref 78.0–100.0)
Platelets: 150 10*3/uL (ref 150–400)
RBC: 4.26 MIL/uL (ref 4.22–5.81)
RDW: 14.2 % (ref 11.5–15.5)
WBC: 17.3 10*3/uL — ABNORMAL HIGH (ref 4.0–10.5)

## 2018-06-19 LAB — HIV ANTIBODY (ROUTINE TESTING W REFLEX): HIV SCREEN 4TH GENERATION: NONREACTIVE

## 2018-06-19 LAB — HEPARIN LEVEL (UNFRACTIONATED): Heparin Unfractionated: 0.32 IU/mL (ref 0.30–0.70)

## 2018-06-19 LAB — STREP PNEUMONIAE URINARY ANTIGEN: Strep Pneumo Urinary Antigen: NEGATIVE

## 2018-06-19 LAB — LACTIC ACID, PLASMA: LACTIC ACID, VENOUS: 2.4 mmol/L — AB (ref 0.5–1.9)

## 2018-06-19 MED ORDER — VALPROATE SODIUM 500 MG/5ML IV SOLN
500.0000 mg | Freq: Three times a day (TID) | INTRAVENOUS | Status: DC
  Filled 2018-06-19 (×2): qty 5

## 2018-06-19 MED ORDER — MIDAZOLAM BOLUS VIA INFUSION (WITHDRAWAL LIFE SUSTAINING TX)
5.0000 mg | INTRAVENOUS | Status: DC | PRN
  Filled 2018-06-19: qty 20

## 2018-06-19 MED ORDER — SODIUM CHLORIDE 0.9 % IV SOLN
10.0000 mg/h | INTRAVENOUS | Status: DC
  Administered 2018-06-19: 10 mg/h via INTRAVENOUS
  Filled 2018-06-19: qty 10

## 2018-06-19 MED ORDER — MORPHINE 100MG IN NS 100ML (1MG/ML) PREMIX INFUSION
10.0000 mg/h | INTRAVENOUS | Status: DC
  Administered 2018-06-19: 10 mg/h via INTRAVENOUS
  Filled 2018-06-19: qty 100

## 2018-06-19 MED ORDER — MORPHINE BOLUS VIA INFUSION
5.0000 mg | INTRAVENOUS | Status: DC | PRN
  Filled 2018-06-19: qty 20

## 2018-06-19 MED ORDER — VALPROATE SODIUM 500 MG/5ML IV SOLN
15.0000 mg/kg | Freq: Once | INTRAVENOUS | Status: AC
  Administered 2018-06-19: 957 mg via INTRAVENOUS
  Filled 2018-06-19: qty 9.57

## 2018-06-19 MED ORDER — CHLORHEXIDINE GLUCONATE CLOTH 2 % EX PADS
6.0000 | MEDICATED_PAD | Freq: Every day | CUTANEOUS | Status: DC
  Administered 2018-06-18: 6 via TOPICAL

## 2018-06-20 LAB — URINE CULTURE: CULTURE: NO GROWTH

## 2018-06-23 LAB — CULTURE, BLOOD (ROUTINE X 2)
Culture: NO GROWTH
Culture: NO GROWTH
Special Requests: ADEQUATE

## 2018-06-24 LAB — CULTURE, RESPIRATORY W GRAM STAIN

## 2018-06-24 LAB — CULTURE, RESPIRATORY

## 2018-06-29 ENCOUNTER — Telehealth: Payer: Self-pay

## 2018-06-29 NOTE — Procedures (Signed)
Extubation Procedure Note  Patient Details:   Name: Peter Howell DOB: 27-Sep-1961 MRN: [id number]   Airway Documentation:    Vent end date: 06/11/2018 Vent end time: 2023   Evaluation  O2 sats: currently acceptable Complications: No apparent complications Patient did not tolerate procedure well. Bilateral Breath Sounds: Clear, Diminished   No  Roanna RaiderWoodall, Hanae Waiters C 06/13/2018, 8:26 PM

## 2018-06-29 NOTE — Death Summary Note (Signed)
DEATH SUMMARY   Patient Details  Name: Peter Howell MRN: [id number] DOB: 1961/06/06  Admission/Discharge Information   Admit Date:  07-11-18  Date of Death: Date of Death: 2018-07-12  Time of Death: Time of Death: Feb 27, 2052  Length of Stay: 1  Referring Physician: Patient, No Pcp Per   Reason(s) for Hospitalization  Cardiac arrest  Diagnoses  Preliminary cause of death:  Secondary Diagnoses (including complications and co-morbidities):  Active Problems:   Cardiac arrest [facility])   Brief Hospital Course (including significant findings, care, treatment, and services provided and events leading to death)  Peter Howell is a 57 y.o. year old male who was found on the street unresponsive and EMS was activated by a bystander.  CPR was initiated and the presenting rhythm was asystole which eventually converted to PEA.  ROSC was obtained after 14 minutes of CPR.  On arrival in hospital he was noted to be having repeated episodes of myoclonic seizures.  EEG showed burst suppression with episodes of myoclonic discharges.  He continued episodes of mild clonus when propofol weaned.  Family has arrived.  States the patient had been living with his girlfriend.  Did endorse a history of excessive alcohol consumption but were not aware of any current cocaine use.  Stated the patient would not want to live in a vegetative state.  He opted for withdrawal of care and the patient passed away in hospital.  Pertinent Labs and Studies  Significant Diagnostic Studies Dg Abd 1 View  Result Date: 07/11/18 CLINICAL DATA:  Orogastric tube placement. EXAM: ABDOMEN - 1 VIEW COMPARISON:  None. FINDINGS: Orogastric tube extends below the diaphragm to curl within the proximal stomach. IMPRESSION: Well-positioned orogastric tube. Electronically Signed   By: Amie Portland M.D.   On: Jul 11, 2018 18:30   Ct Head Wo Contrast  Result Date: 2018-07-11 CLINICAL DATA:  Unresponsive with cardiac arrest EXAM: CT HEAD  WITHOUT CONTRAST TECHNIQUE: Contiguous axial images were obtained from the base of the skull through the vertex without intravenous contrast. COMPARISON:  Head CT January 11, 2015 and brain MRI January 12, 2015 FINDINGS: Brain: There is mild-to-moderate atrophy for age, stable. There is no intracranial mass, hemorrhage, extra-axial fluid collection, or midline shift. Small vessel disease in the centra semiovale bilaterally stable. There is no acute infarct evident. Vascular: No hyperdense vessel. There is calcification in each carotid siphon region. Skull: Bony calvarium appears intact. Sinuses/Orbits: There is mucosal thickening in each maxillary antrum. There is mucosal thickening in several ethmoid air cells. Other paranasal sinuses are clear. Orbits appear symmetric bilaterally except for evidence of cataract removal previously on the left. Other: Mastoid air cells are clear. IMPRESSION: Stable atrophy with patchy periventricular small vessel disease. No acute infarct evident. No evident mass or hemorrhage. There are foci of arterial vascular calcification. There is paranasal sinus disease at several sites. Electronically Signed   By: Bretta Bang III M.D.   On: 07-11-2018 11:12   Dg Chest Portable 1 View  Result Date: 07/11/18 CLINICAL DATA:  Central line placement. EXAM: PORTABLE CHEST 1 VIEW COMPARISON:  Jul 11, 2018. FINDINGS: Interim extubation. Interim placement of left IJ line, its tip is projected superior vena cava. NG tube noted with tip below left hemidiaphragm. Heart size normal. IMPRESSION: 1. Interim extubation. Interim placement of left IJ line, its tip is over the superior vena cava. NG tube noted with tip below left hemidiaphragm. 2.  No acute cardiopulmonary disease. Electronically Signed   By: Maisie Fus  Register   On: 2018-07-11  12:31   Dg Chest Portable 1 View  Result Date: 2018/07/07 CLINICAL DATA:  Post arrest. EXAM: PORTABLE CHEST 1 VIEW COMPARISON:  01/11/2015. FINDINGS:  Endotracheal tube and NG tube in stable position. Heart size normal. Lungs are clear. No pleural effusion or pneumothorax. No acute bony abnormality. IMPRESSION: 1.  Lines and tubes stable position. 2.  No acute cardiopulmonary disease. Electronically Signed   By: Maisie Fus  Register   On: 2018-07-07 10:53    Microbiology Recent Results (from the past 240 hour(s))  Culture, blood (routine x 2)     Status: None   Collection Time: 07-07-2018 11:55 AM  Result Value Ref Range Status   Specimen Description BLOOD LEFT ANTECUBITAL  Final   Special Requests   Final    BOTTLES DRAWN AEROBIC ONLY Blood Culture adequate volume   Culture   Final    NO GROWTH 5 DAYS Performed at [facility], [address], [place], [place] [address]    Report Status 06/23/2018 FINAL  Final  Culture, blood (routine x 2)     Status: None   Collection Time: July 07, 2018 11:55 AM  Result Value Ref Range Status   Specimen Description BLOOD RIGHT ANTECUBITAL  Final   Special Requests   Final    BOTTLES DRAWN AEROBIC ONLY Blood Culture results may not be optimal due to an inadequate volume of blood received in culture bottles   Culture   Final    NO GROWTH 5 DAYS Performed at [facility], [address] [place], [place] [address]    Report Status 06/23/2018 FINAL  Final  Culture, respiratory (tracheal aspirate)     Status: None   Collection Time: 2018-07-07 12:24 PM  Result Value Ref Range Status   Specimen Description TRACHEAL ASPIRATE  Final   Special Requests NONE  Final   Gram Stain   Final    ABUNDANT WBC PRESENT, PREDOMINANTLY PMN ABUNDANT GRAM POSITIVE COCCI IN PAIRS RARE GRAM NEGATIVE COCCOBACILLI Performed at [facility], [address], [place], [place] [address]    Culture RARE STAPHYLOCOCCUS AUREUS  Final   Report Status 06/24/2018 FINAL  Final   Organism ID, Bacteria STAPHYLOCOCCUS AUREUS  Final      Susceptibility   Staphylococcus aureus - MIC*    CIPROFLOXACIN <=0.5 SENSITIVE  Sensitive     ERYTHROMYCIN <=0.25 SENSITIVE Sensitive     GENTAMICIN <=0.5 SENSITIVE Sensitive     OXACILLIN <=0.25 SENSITIVE Sensitive     TETRACYCLINE <=1 SENSITIVE Sensitive     VANCOMYCIN <=0.5 SENSITIVE Sensitive     TRIMETH/SULFA <=10 SENSITIVE Sensitive     CLINDAMYCIN <=0.25 SENSITIVE Sensitive     RIFAMPIN <=0.5 SENSITIVE Sensitive     Inducible Clindamycin NEGATIVE Sensitive     * RARE STAPHYLOCOCCUS AUREUS  MRSA PCR Screening     Status: None   Collection Time: 07-07-2018  2:26 PM  Result Value Ref Range Status   MRSA by PCR NEGATIVE NEGATIVE Final    Comment:        The GeneXpert MRSA Assay (FDA approved for NASAL specimens only), is one component of a comprehensive MRSA colonization surveillance program. It is not intended to diagnose MRSA infection nor to guide or monitor treatment for MRSA infections. Performed at [facility], [address], [place], [place] [address]   Urine culture     Status: None   Collection Time: 2018-07-07 10:22 PM  Result Value Ref Range Status   Specimen Description URINE,  RANDOM  Final   Special Requests NONE  Final   Culture   Final    NO GROWTH Performed at [facility], [address], [place], [place] [address]    Report Status 06/20/2018 FINAL  Final    Lab Basic Metabolic Panel: Recent Labs  Lab 06/20/18 0403  NA 139  K 3.1*  CL 110  CO2 15*  GLUCOSE 161*  BUN 14  CREATININE 0.96  CALCIUM 7.9*   Liver Function Tests: No results for input(s): AST, ALT, ALKPHOS, BILITOT, PROT, ALBUMIN in the last 168 hours. No results for input(s): LIPASE, AMYLASE in the last 168 hours. No results for input(s): AMMONIA in the last 168 hours. CBC: Recent Labs  Lab 06-20-18 0403  WBC 17.3*  HGB 13.9  HCT 39.8  MCV 93.4  PLT 150   Cardiac Enzymes: Recent Labs  Lab 06/03/2018 1900 Jun 20, 2018 0025  TROPONINI 21.26* 18.93*   Sepsis Labs: Recent Labs  Lab Jun 20, 2018 0403  WBC 17.3*  LATICACIDVEN 2.4*     Procedures/Operations  Mechanical ventilation   Siarra Gilkerson 06/25/2018, 3:59 PM

## 2018-06-29 NOTE — Progress Notes (Signed)
CRITICAL VALUE ALERT  Critical Value:  Lactic acid 2.4  Date & Time Notied:  15-Jun-2018 @ 0530  Provider Notified: Pola CornElink  Orders Received/Actions taken:

## 2018-06-29 NOTE — H&P (Signed)
NAME:  Peter LucksLindsey M Howell, MRN:  [id number], DOB:  11-Jun-1961, LOS: 1 ADMISSION DATE:  06/10/2018,  REFERRING MD: Emergency department physician  CHIEF COMPLAINT: Out-of-hospital cardiac arrest  Brief History   The patient is a 57 year old man with a history of hypertension and current alcohol abuse.  He was found on the street unresponsive and EMS was activated by a bystander.  CPR was initiated and the presenting rhythm was asystole which eventually converted to PEA.  Peter Howell was obtained after 14 minutes of CPR.  On arrival in hospital he was noted to be having repeated episodes of myoclonic seizures.  EEG showed burst suppression with episodes of myoclonic discharges.  Subjective:   Continued episodes of mild clonus when propofol weaned.  Family has arrived.  States the patient had been living with his girlfriend.  Did endorse a history of excessive alcohol consumption but were not aware of any current cocaine use.  Stated the patient would not want to live in a vegetative state.  Objective   Blood pressure 119/71, pulse (!) 103, temperature 98.7 F (37.1 C), temperature source Core, resp. rate (!) 24, height 5\' 6"  (1.676 m), weight 80.3 kg, SpO2 100 %.    Vent Mode: PCV FiO2 (%):  [40 %-50 %] 40 % Set Rate:  [24 bmp-28 bmp] 24 bmp PEEP:  [5 cmH20] 5 cmH20 Plateau Pressure:  [18 cmH20-27 cmH20] 19 cmH20   Intake/Output Summary (Last 24 hours) at 10-30-17 1757 Last data filed at 10-30-17 1700 Gross per 24 hour  Intake 4639.13 ml  Output 2120 ml  Net 2519.13 ml   Filed Weights   06/15/2018 1000 2017/11/20 0424  Weight: 79.3 kg 80.3 kg    Examination: General: Well-nourished well-developed male currently intubated and sedated. HENT: ETT/OGT in place with no pressure ulceration. Lungs: Mechanically ventilated.  No ventilator dyssynchrony.  Not overbreathing set rate.  Lungs are clear. Cardiovascular: Heart sounds normal. Abdomen: Soft decreased bowel sounds Extremities: Without  edema Neuro:Pupils equal poorly reactive with upward gaze.  No obvious myoclonic jerking at this time.  No response to painful stimuli. GU: Foley catheter  I personally performed a bedside echocardiogram which showed normal LV size and function, normal RV size and function and no valvular heart disease.  No pericardial effusion was detected.  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  Adverse prognostic factors include asystole as initial presenting rhythm and early myoclonic jerking. Have discussed the patient's condition with the family members.  Have indicated to them that the likelihood of recover to a meaningful status of independent living is very poor and that ongoing dependency on medical care is most likely outcome.  At this time they have decided to proceed with withdrawal of care.  Status post cardiac arrest likely secondary to primary respiratory event.  Suspect the patient stopped breathing from alcohol intoxication. echocardiogram and repeat ECGs do not support a diagnosis of primary myocardial infarction.    Diet: N.p.o. Pain/Anxiety/Delirium protocol (if indicated):  VAP protocol (if indicated)\ DVT PAS GI prophylaxis: Proton pump Hyperglycemia protocol:  Mobility: Bedrest Code Status: DNR Family Communication: Sister updated at bedside confirmed a DNR status  Labs    Reactive leukocytosis at 17.3  Mild hypokalemia at 3.1 mild metabolic acidosis at 15.5  Elevated but downtrending troponin 18.9 Marginally elevated lactate 2.4  ECG show borderline inferior Q wave abnormalities consistent with prior remote infarction.  No evidence of acute anterior  infarction.  No evolutionary ECG changes to support acute coronary syndrome.  CRITICAL CARE Performed by: Lynnell Catalan  Total critical care time: 40 minutes  Critical care time was exclusive of separately billable procedures and  treating other patients.  Critical care was necessary to treat or prevent imminent or life-threatening deterioration.  Critical care was time spent personally by me on the following activities: development of treatment plan with patient and/or surrogate as well as nursing, discussions with consultants, evaluation of patient's response to treatment, examination of patient, obtaining history from patient or surrogate, ordering and performing treatments and interventions, ordering and review of laboratory studies, ordering and review of radiographic studies, pulse oximetry and re-evaluation of patient's condition.  Lynnell Catalan, MD [facility] ICU Physician [facility]  Pager: [phone] Mobile: [phone] After hours: [phone].

## 2018-06-29 NOTE — Progress Notes (Signed)
Mindy Gladstone Pihlias witnessed Everrett Coombeana Lexus Barletta waste 25mg  versed and 75mg  morphine in the sink.  Caswell CorwinDana C Erik Burkett, RN 06/20/2018 10:48 PM

## 2018-06-29 NOTE — Telephone Encounter (Signed)
Received d/c from [facility] (original). DC is for cremation. Patient of Doctor Agarwala.   D/C will be taken to [address] [place] for signature.  On 06/30/18 received d/c back from Doctor Agarwala.  Called funeral home to let them know d/c was ready and funeral home requested d/c to be mailed to [facility].  I also faxed a copy to the funeral home per the funeral home request.

## 2018-06-29 NOTE — Progress Notes (Signed)
eLink Physician-Brief Progress Note Patient Name: Peter Howell DOB: 09/22/1961 MRN: [id number]   Date of Service  04/12/18  HPI/Events of Note  Urinary retention with 1000 cc urine in the bladder  eICU Interventions  Foley catheter        Migdalia DkOkoronkwo U [name] 04/12/18, 12:37 AM

## 2018-06-29 NOTE — Progress Notes (Signed)
ANTICOAGULATION CONSULT NOTE  Pharmacy Consult for heparin Indication: chest pain/ACS  No Known Allergies  Patient Measurements: Height: 5\' 6"  (167.6 cm) Weight: 177 lb 0.5 oz (80.3 kg) IBW/kg (Calculated) : 63.8 Heparin Dosing Weight: 79.3 kg  Vital Signs: Temp: 100.3 F (37.9 C) (09/21 0704) Temp Source: Core (09/21 0704) BP: 95/60 (09/21 0747) Pulse Rate: 68 (09/21 0900)  Labs: Recent Labs    01/19/2018 1034 01/19/2018 1049 01/19/2018 1320 01/19/2018 1900 06/10/2018 0025 06/02/2018 0403  HGB 15.5 16.7 14.5  --   --  13.9  HCT 48.6 49.0 44.3  --   --  39.8  PLT 219  --  163  --   --  150  APTT  --   --  >200*  --   --   --   LABPROT 14.9  --  15.8*  --   --   --   INR 1.18  --  1.27  --   --   --   HEPARINUNFRC  --   --   --  0.30  --  0.32  CREATININE 1.35* 1.00 1.21  --   --  0.96  TROPONINI 1.75*  --  12.59* 21.26* 18.93*  --     Estimated Creatinine Clearance: 84.5 mL/min (by C-G formula based on SCr of 0.96 mg/dL).   Assessment: [age] yom s/p CPR continues on IV heparin for r/o ACS. Heparin level remains therapeutic. No bleeding noted. CBC is stable.   Goal of Therapy:  Heparin level 0.3-0.7 units/ml Monitor platelets by anticoagulation protocol: Yes   Plan:  Continue heparin gtt 950 units/hr Daily heparin level and CBC  Lysle Pearlachel Limuel Nieblas, PharmD, BCPS Please see AMION for all pharmacy numbers 06/26/2018 9:53 AM

## 2018-06-29 NOTE — Progress Notes (Signed)
eLink Physician-Brief Progress Note Patient Name: Peter Howell DOB: 09-Feb-1961 MRN: [id number]   Date of Service  06/18/2018  HPI/Events of Note  Respiratory alkalosis on current ventilator settings  Of PC 35, PEEP 5, Rate 28  eICU Interventions  PC reduced to 20, PEEP 5, Rate 24 ABG to be obtained in 30 minutes.        [name] 06/13/2018, 5:00 AM

## 2018-06-29 NOTE — Progress Notes (Signed)
Pt was extubated per MD orders and family wishes. Family at bedside. Mouth care performed. Comfort care tray provided for family.  Caswell CorwinDana C Coreena Rubalcava, RN 10/17/2017 8:32 PM

## 2018-06-29 DEATH — deceased

## 2020-06-23 IMAGING — DX DG CHEST 1V PORT
1 series · 1 of 1 positions shown · non-contrast
Comparison: 01/11/2015.

CLINICAL DATA: Post arrest.

EXAM:
PORTABLE CHEST 1 VIEW

[chest]
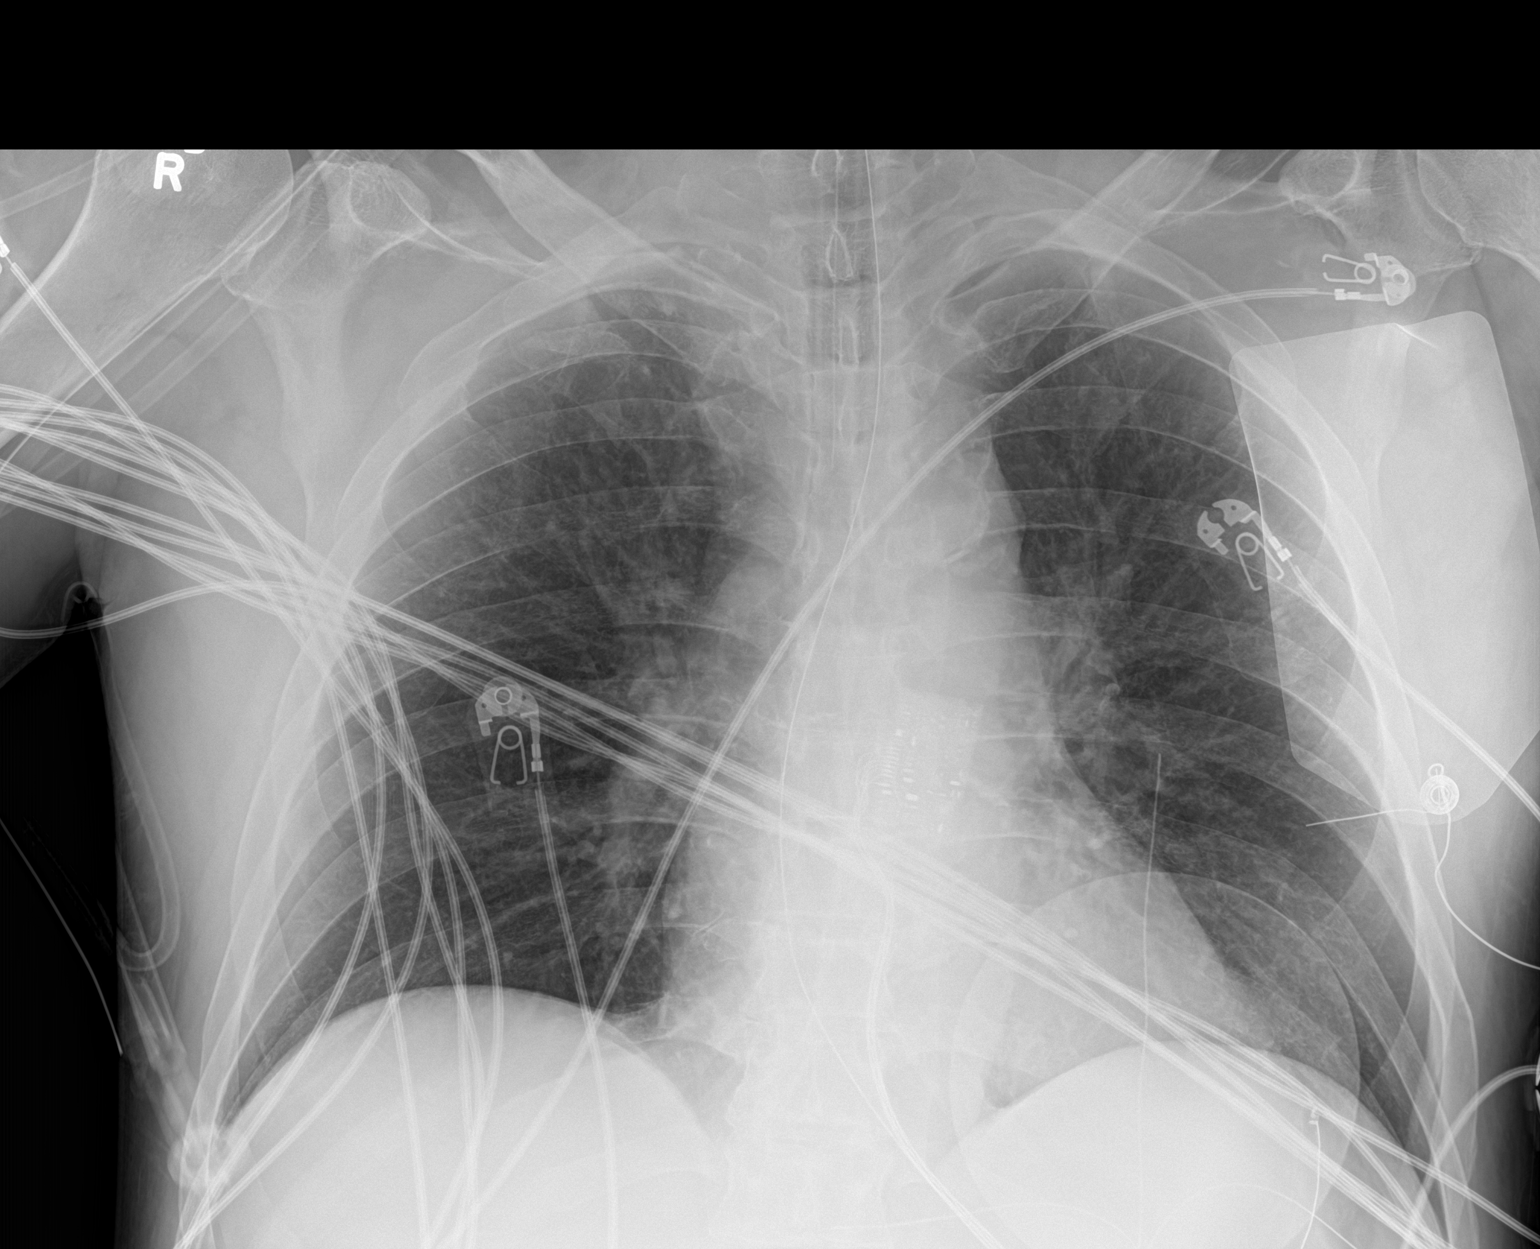

[1 of 1 positions shown; findings below may reference images not displayed]

FINDINGS: Endotracheal tube and NG tube in stable position. Heart size normal.
Lungs are clear. No pleural effusion or pneumothorax. No acute bony
abnormality.
IMPRESSION: 1.  Lines and tubes stable position.

2.  No acute cardiopulmonary disease.
